# Patient Record
Sex: Female | Born: 1970 | Race: White | Hispanic: No | Smoking: Former smoker
Health system: Southern US, Community
[De-identification: ages and names within clinical notes are randomized; demographics above are authoritative.]

## PROBLEM LIST (undated history)

## (undated) DIAGNOSIS — O039 Complete or unspecified spontaneous abortion without complication: Secondary | ICD-10-CM

## (undated) DIAGNOSIS — Z720 Tobacco use: Secondary | ICD-10-CM

## (undated) DIAGNOSIS — N200 Calculus of kidney: Secondary | ICD-10-CM

## (undated) DIAGNOSIS — I10 Essential (primary) hypertension: Secondary | ICD-10-CM

## (undated) DIAGNOSIS — N3020 Other chronic cystitis without hematuria: Secondary | ICD-10-CM

## (undated) DIAGNOSIS — R87629 Unspecified abnormal cytological findings in specimens from vagina: Secondary | ICD-10-CM

## (undated) DIAGNOSIS — F419 Anxiety disorder, unspecified: Secondary | ICD-10-CM

## (undated) DIAGNOSIS — Z8759 Personal history of other complications of pregnancy, childbirth and the puerperium: Secondary | ICD-10-CM

## (undated) DIAGNOSIS — Z1371 Encounter for nonprocreative screening for genetic disease carrier status: Secondary | ICD-10-CM

## (undated) HISTORY — DX: Encounter for nonprocreative screening for genetic disease carrier status: Z13.71

## (undated) HISTORY — PX: ENDOMETRIAL ABLATION: SHX621

## (undated) HISTORY — PX: TUBAL LIGATION: SHX77

## (undated) HISTORY — DX: Other chronic cystitis without hematuria: N30.20

## (undated) HISTORY — DX: Unspecified abnormal cytological findings in specimens from vagina: R87.629

## (undated) HISTORY — DX: Personal history of other complications of pregnancy, childbirth and the puerperium: Z87.59

## (undated) HISTORY — PX: TONSILLECTOMY: SUR1361

## (undated) HISTORY — DX: Calculus of kidney: N20.0

## (undated) HISTORY — DX: Anxiety disorder, unspecified: F41.9

## (undated) HISTORY — DX: Complete or unspecified spontaneous abortion without complication: O03.9

---

## 1997-08-12 DIAGNOSIS — N200 Calculus of kidney: Secondary | ICD-10-CM

## 1997-08-12 HISTORY — DX: Calculus of kidney: N20.0

## 1998-10-21 ENCOUNTER — Emergency Department (HOSPITAL_COMMUNITY): Admission: EM | Admit: 1998-10-21 | Discharge: 1998-10-21 | Payer: Self-pay | Admitting: Emergency Medicine

## 1998-12-30 ENCOUNTER — Inpatient Hospital Stay (HOSPITAL_COMMUNITY): Admission: EM | Admit: 1998-12-30 | Discharge: 1999-01-02 | Payer: Self-pay | Admitting: Emergency Medicine

## 1998-12-31 ENCOUNTER — Encounter: Payer: Self-pay | Admitting: Family Medicine

## 2000-12-30 ENCOUNTER — Other Ambulatory Visit: Admission: RE | Admit: 2000-12-30 | Discharge: 2000-12-30 | Payer: Self-pay | Admitting: Gynecology

## 2002-04-21 ENCOUNTER — Other Ambulatory Visit: Admission: RE | Admit: 2002-04-21 | Discharge: 2002-04-21 | Payer: Self-pay | Admitting: Family Medicine

## 2002-08-12 LAB — CONVERTED CEMR LAB: Pap Smear: NORMAL

## 2003-05-03 ENCOUNTER — Other Ambulatory Visit: Admission: RE | Admit: 2003-05-03 | Discharge: 2003-05-03 | Payer: Self-pay | Admitting: Obstetrics and Gynecology

## 2004-11-28 ENCOUNTER — Emergency Department (HOSPITAL_COMMUNITY): Admission: EM | Admit: 2004-11-28 | Discharge: 2004-11-28 | Payer: Self-pay | Admitting: Emergency Medicine

## 2007-11-03 ENCOUNTER — Encounter: Payer: Self-pay | Admitting: Internal Medicine

## 2007-11-03 ENCOUNTER — Ambulatory Visit: Payer: Self-pay | Admitting: Internal Medicine

## 2007-11-03 ENCOUNTER — Other Ambulatory Visit: Admission: RE | Admit: 2007-11-03 | Discharge: 2007-11-03 | Payer: Self-pay | Admitting: Internal Medicine

## 2007-11-03 ENCOUNTER — Encounter (INDEPENDENT_AMBULATORY_CARE_PROVIDER_SITE_OTHER): Payer: Self-pay | Admitting: *Deleted

## 2007-11-06 ENCOUNTER — Encounter (INDEPENDENT_AMBULATORY_CARE_PROVIDER_SITE_OTHER): Payer: Self-pay | Admitting: *Deleted

## 2007-11-06 LAB — CONVERTED CEMR LAB
ALT: 19 units/L (ref 0–35)
AST: 20 units/L (ref 0–37)
BUN: 7 mg/dL (ref 6–23)
Basophils Absolute: 0 10*3/uL (ref 0.0–0.1)
Basophils Relative: 0.4 % (ref 0.0–1.0)
CO2: 26 meq/L (ref 19–32)
Calcium: 9.7 mg/dL (ref 8.4–10.5)
Chloride: 106 meq/L (ref 96–112)
Cholesterol: 240 mg/dL (ref 0–200)
Creatinine, Ser: 0.7 mg/dL (ref 0.4–1.2)
Direct LDL: 172.4 mg/dL
Eosinophils Absolute: 0.3 10*3/uL (ref 0.0–0.6)
Eosinophils Relative: 2.5 % (ref 0.0–5.0)
GFR calc Af Amer: 122 mL/min
GFR calc non Af Amer: 101 mL/min
Glucose, Bld: 79 mg/dL (ref 70–99)
HCT: 44.8 % (ref 36.0–46.0)
Hemoglobin: 15 g/dL (ref 12.0–15.0)
Lymphocytes Relative: 25.8 % (ref 12.0–46.0)
MCHC: 33.6 g/dL (ref 30.0–36.0)
MCV: 88.5 fL (ref 78.0–100.0)
Monocytes Absolute: 0.6 10*3/uL (ref 0.2–0.7)
Monocytes Relative: 5.9 % (ref 3.0–11.0)
Neutro Abs: 6.6 10*3/uL (ref 1.4–7.7)
Neutrophils Relative %: 65.4 % (ref 43.0–77.0)
Platelets: 203 10*3/uL (ref 150–400)
Potassium: 3.6 meq/L (ref 3.5–5.1)
RBC: 5.06 M/uL (ref 3.87–5.11)
RDW: 13 % (ref 11.5–14.6)
Sodium: 140 meq/L (ref 135–145)
TSH: 1.68 microintl units/mL (ref 0.35–5.50)
WBC: 10.1 10*3/uL (ref 4.5–10.5)

## 2007-11-10 ENCOUNTER — Encounter (INDEPENDENT_AMBULATORY_CARE_PROVIDER_SITE_OTHER): Payer: Self-pay | Admitting: *Deleted

## 2007-11-16 ENCOUNTER — Encounter: Payer: Self-pay | Admitting: Internal Medicine

## 2007-12-30 ENCOUNTER — Encounter: Admission: RE | Admit: 2007-12-30 | Discharge: 2007-12-30 | Payer: Self-pay | Admitting: Internal Medicine

## 2008-01-01 ENCOUNTER — Encounter (INDEPENDENT_AMBULATORY_CARE_PROVIDER_SITE_OTHER): Payer: Self-pay | Admitting: *Deleted

## 2008-03-14 ENCOUNTER — Ambulatory Visit: Payer: Self-pay | Admitting: Internal Medicine

## 2008-03-14 DIAGNOSIS — J069 Acute upper respiratory infection, unspecified: Secondary | ICD-10-CM | POA: Insufficient documentation

## 2008-03-14 DIAGNOSIS — N390 Urinary tract infection, site not specified: Secondary | ICD-10-CM | POA: Insufficient documentation

## 2008-03-16 ENCOUNTER — Encounter: Payer: Self-pay | Admitting: Internal Medicine

## 2008-03-16 LAB — CONVERTED CEMR LAB

## 2008-05-12 ENCOUNTER — Telehealth (INDEPENDENT_AMBULATORY_CARE_PROVIDER_SITE_OTHER): Payer: Self-pay | Admitting: *Deleted

## 2008-05-12 ENCOUNTER — Ambulatory Visit: Payer: Self-pay | Admitting: Internal Medicine

## 2008-05-12 LAB — CONVERTED CEMR LAB
Glucose, Urine, Semiquant: NEGATIVE
Specific Gravity, Urine: >=1.03

## 2008-05-13 ENCOUNTER — Encounter: Payer: Self-pay | Admitting: Internal Medicine

## 2008-05-13 LAB — CONVERTED CEMR LAB

## 2008-05-17 ENCOUNTER — Telehealth (INDEPENDENT_AMBULATORY_CARE_PROVIDER_SITE_OTHER): Payer: Self-pay | Admitting: *Deleted

## 2008-05-31 ENCOUNTER — Emergency Department (HOSPITAL_BASED_OUTPATIENT_CLINIC_OR_DEPARTMENT_OTHER): Admission: EM | Admit: 2008-05-31 | Discharge: 2008-05-31 | Payer: Self-pay | Admitting: Emergency Medicine

## 2008-05-31 ENCOUNTER — Ambulatory Visit: Payer: Self-pay | Admitting: Internal Medicine

## 2008-05-31 DIAGNOSIS — N209 Urinary calculus, unspecified: Secondary | ICD-10-CM | POA: Insufficient documentation

## 2008-06-02 ENCOUNTER — Encounter (INDEPENDENT_AMBULATORY_CARE_PROVIDER_SITE_OTHER): Payer: Self-pay | Admitting: *Deleted

## 2008-06-02 ENCOUNTER — Ambulatory Visit: Payer: Self-pay | Admitting: Internal Medicine

## 2008-06-04 ENCOUNTER — Emergency Department (HOSPITAL_COMMUNITY): Admission: EM | Admit: 2008-06-04 | Discharge: 2008-06-04 | Payer: Self-pay | Admitting: Emergency Medicine

## 2009-01-27 ENCOUNTER — Telehealth: Payer: Self-pay | Admitting: Internal Medicine

## 2009-05-08 ENCOUNTER — Ambulatory Visit: Payer: Self-pay | Admitting: Internal Medicine

## 2009-05-08 DIAGNOSIS — F411 Generalized anxiety disorder: Secondary | ICD-10-CM | POA: Insufficient documentation

## 2009-05-08 DIAGNOSIS — A6000 Herpesviral infection of urogenital system, unspecified: Secondary | ICD-10-CM | POA: Insufficient documentation

## 2009-05-08 LAB — CONVERTED CEMR LAB
Nitrite: NEGATIVE
Protein, U semiquant: NEGATIVE
Urobilinogen, UA: 0.2

## 2009-05-09 ENCOUNTER — Encounter: Payer: Self-pay | Admitting: Internal Medicine

## 2009-05-10 ENCOUNTER — Encounter: Payer: Self-pay | Admitting: Internal Medicine

## 2009-05-12 ENCOUNTER — Telehealth (INDEPENDENT_AMBULATORY_CARE_PROVIDER_SITE_OTHER): Payer: Self-pay | Admitting: *Deleted

## 2009-07-31 IMAGING — CT CT ABDOMEN W/O CM
2 of 4 series · 17 of 46 positions shown, 19 images · non-contrast
Comparison: None

CT ABDOMEN

CLINICAL DATA: Right lower quadrant abdominal pain.

CT ABDOMEN AND PELVIS WITHOUT CONTRAST
TECHNIQUE: Multidetector CT imaging of the abdomen and pelvis was
performed following the standard protocol without intravenous
contrast.

[Series 2: renal stone < 200 lbs 5.0 b31f · axial · 0.82mm/px · z∈[+820,+1270]mm · 14 of 100 slices shown, 16 images]
[im 5/100  soft-tissue]
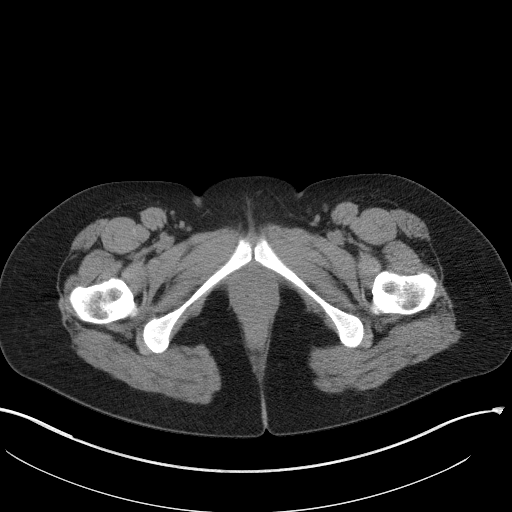
[im 5/100  bone]
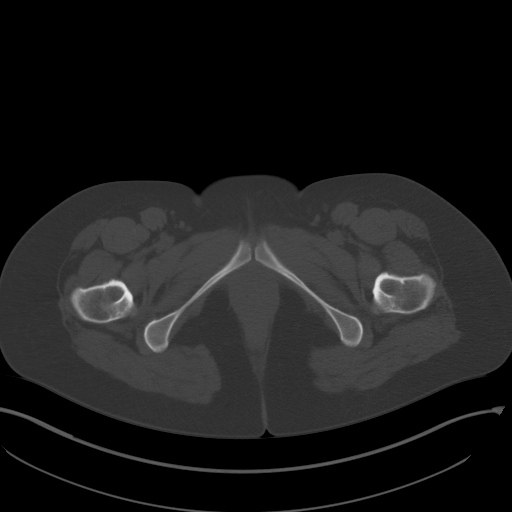
[im 13/100  soft-tissue]
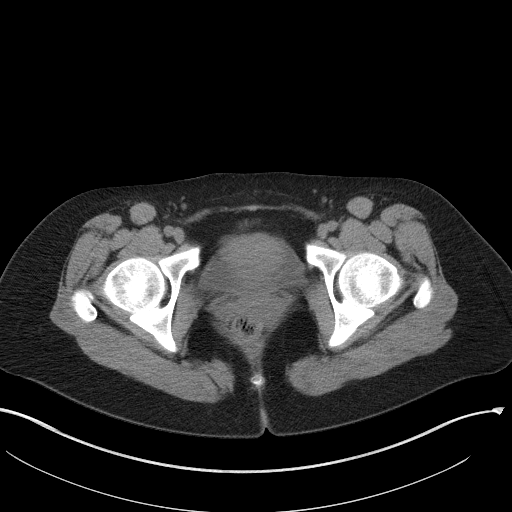
[im 21/100  soft-tissue]
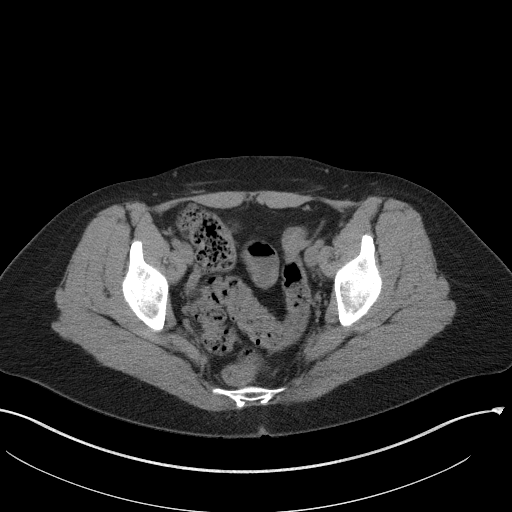
[im 25/100  soft-tissue]
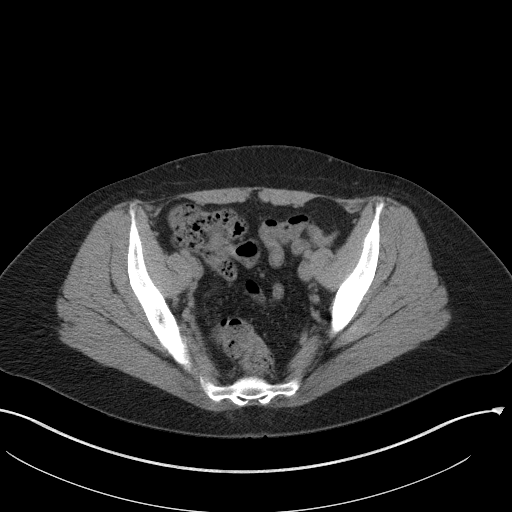
[im 34/100  soft-tissue]
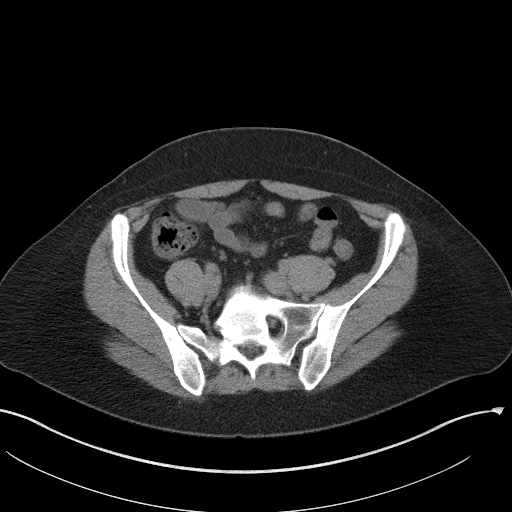
[im 42/100  soft-tissue]
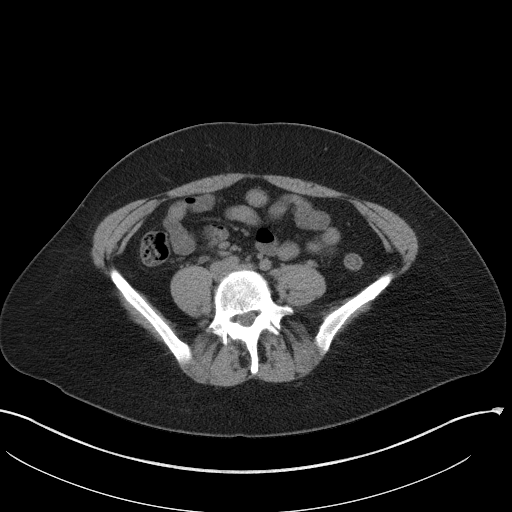
[im 46/100  soft-tissue]
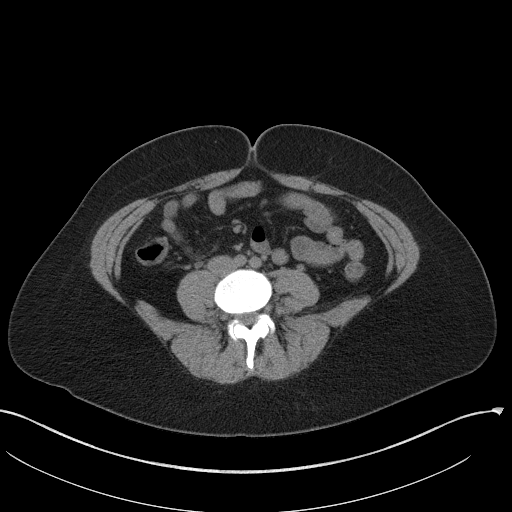
[im 54/100  soft-tissue]
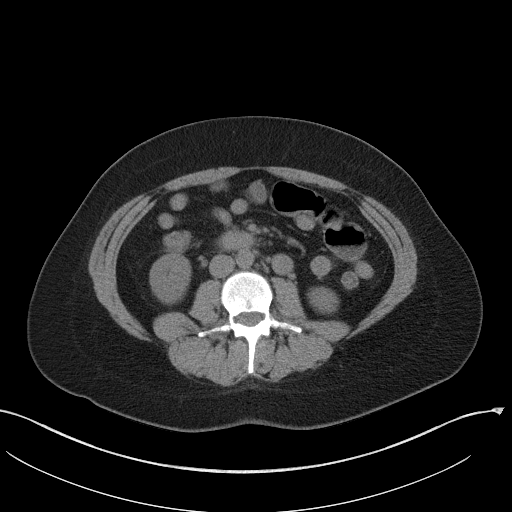
[im 58/100  soft-tissue]
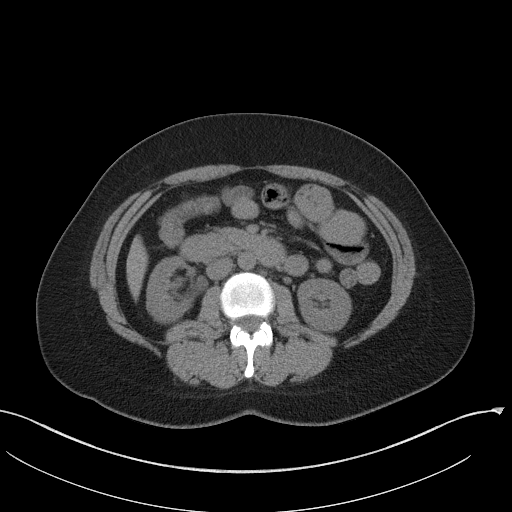
[im 58/100  bone]
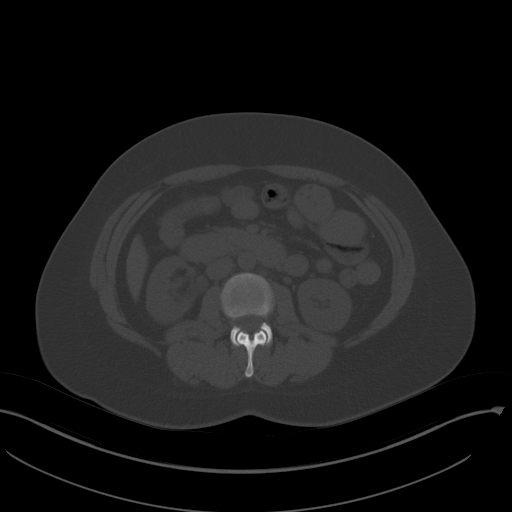
[im 67/100  soft-tissue]
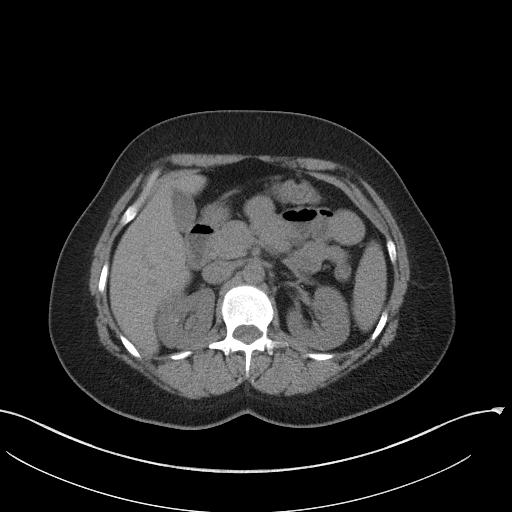
[im 75/100  soft-tissue]
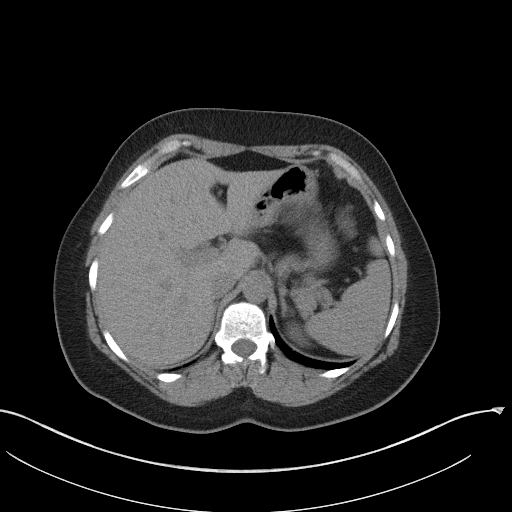
[im 79/100  soft-tissue]
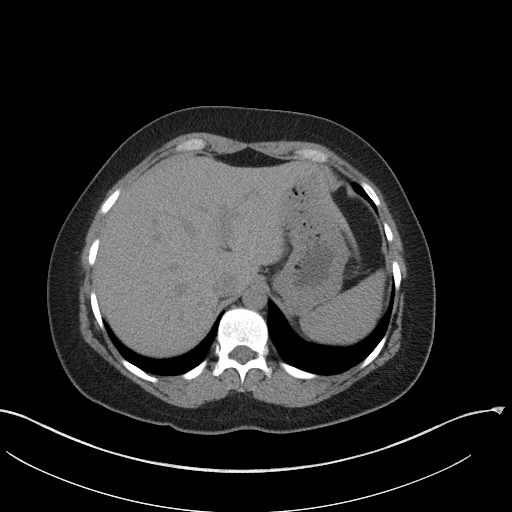
[im 87/100  soft-tissue]
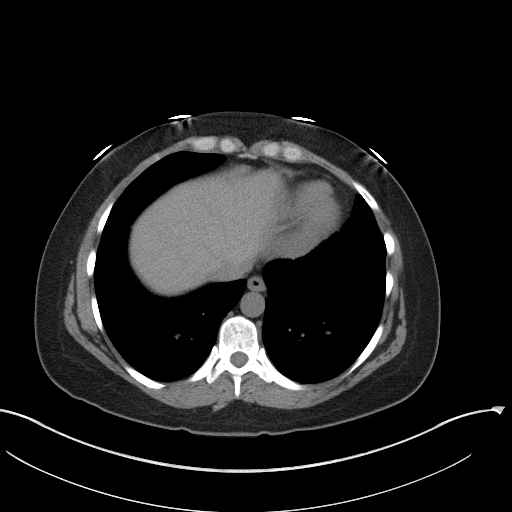
[im 95/100  soft-tissue]
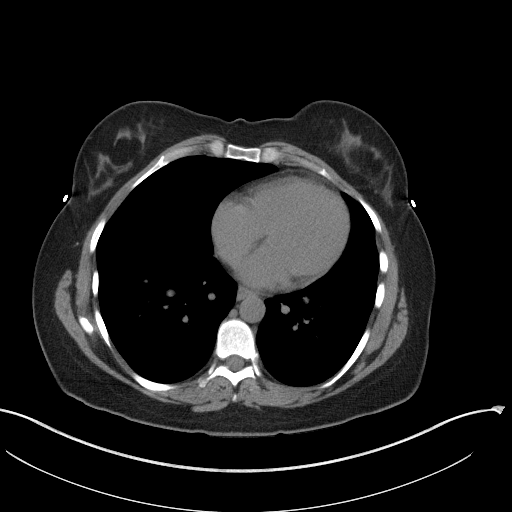

[Series 3: renal stone 2.0 coronal · coronal · 0.88mm/px · 3 of 126 slices shown]
[im 42/126  soft-tissue]
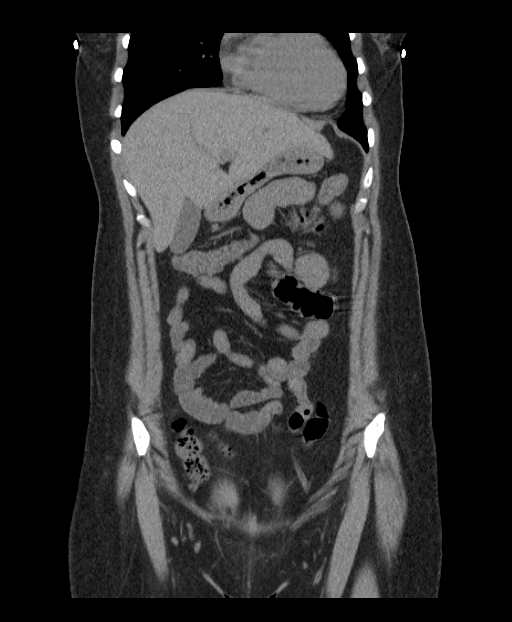
[im 56/126  soft-tissue]
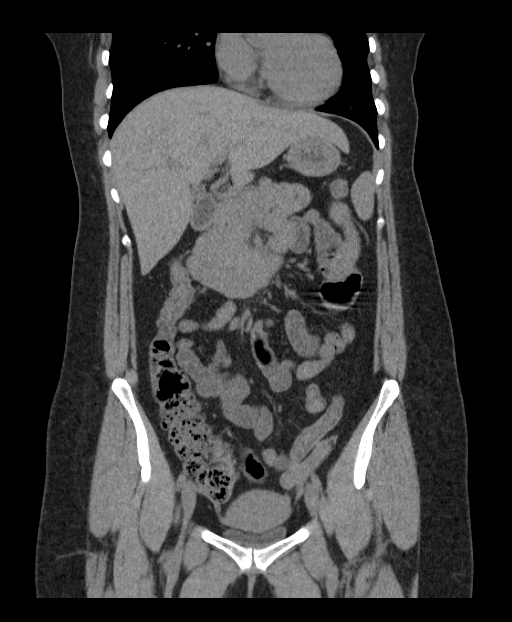
[im 70/126  soft-tissue]
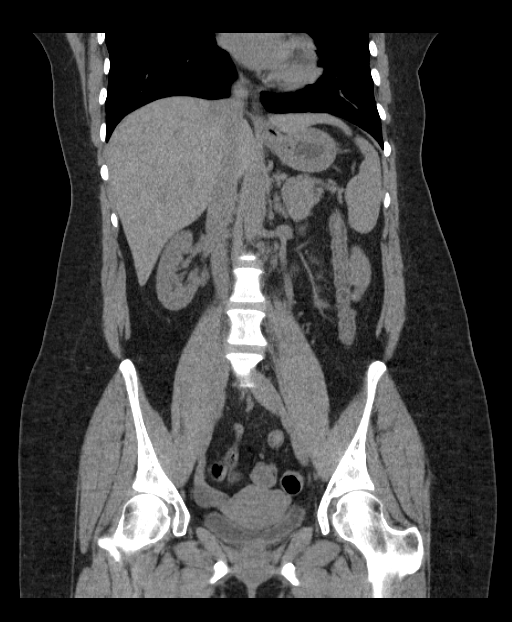

[17 of 46 positions shown; findings below may reference images not displayed]

FINDINGS: There is mild right ureterectasis to the level of a 4 mm
distal right ureteral calculus on image 86.  No significant
hydronephrosis.  1 mm nonobstructing right upper renal pole
calculus is identified on image 35.  No left-sided
nephroureterolithiasis.

Lung bases are clear.  Unenhanced abdominal viscera otherwise
unremarkable.  No lymphadenopathy or ascites.
IMPRESSION: 4 mm distal right ureteral calculus producing mild right
ureterectasis.

CT PELVIS
FINDINGS: The appendix and unopacified bowel are unremarkable.  No
pelvic ascites.  No lymphadenopathy or ascites.  No acute bony
finding. Mild dextrorotatory lumbar spine scoliosis noted.
IMPRESSION: No acute intrapelvic finding.  Please see CT abdomen report above.

## 2009-09-13 ENCOUNTER — Ambulatory Visit: Payer: Self-pay | Admitting: Internal Medicine

## 2009-09-13 LAB — CONVERTED CEMR LAB
Bilirubin Urine: NEGATIVE
Casts: NONE SEEN /lpf
Glucose, Urine, Semiquant: NEGATIVE
Ketones, urine, test strip: NEGATIVE
Protein, U semiquant: NEGATIVE

## 2009-09-14 ENCOUNTER — Encounter: Payer: Self-pay | Admitting: Internal Medicine

## 2009-10-17 ENCOUNTER — Telehealth: Payer: Self-pay | Admitting: Internal Medicine

## 2009-10-18 ENCOUNTER — Ambulatory Visit: Payer: Self-pay | Admitting: Internal Medicine

## 2009-10-19 ENCOUNTER — Telehealth (INDEPENDENT_AMBULATORY_CARE_PROVIDER_SITE_OTHER): Payer: Self-pay | Admitting: *Deleted

## 2009-10-25 ENCOUNTER — Ambulatory Visit: Payer: Self-pay | Admitting: Family

## 2009-10-25 LAB — CONVERTED CEMR LAB: Urobilinogen, UA: 0.2

## 2009-10-27 ENCOUNTER — Encounter: Payer: Self-pay | Admitting: Family

## 2010-01-03 ENCOUNTER — Ambulatory Visit: Payer: Self-pay | Admitting: Family Medicine

## 2010-01-03 LAB — CONVERTED CEMR LAB
Glucose, Urine, Semiquant: NEGATIVE
Ketones, urine, test strip: NEGATIVE
Nitrite: NEGATIVE
Protein, U semiquant: NEGATIVE

## 2010-02-20 ENCOUNTER — Telehealth (INDEPENDENT_AMBULATORY_CARE_PROVIDER_SITE_OTHER): Payer: Self-pay | Admitting: *Deleted

## 2010-05-11 ENCOUNTER — Ambulatory Visit: Payer: Self-pay | Admitting: Internal Medicine

## 2010-05-11 LAB — CONVERTED CEMR LAB
Bilirubin Urine: NEGATIVE
Casts: NONE SEEN /lpf
Crystals: NONE SEEN
Protein, U semiquant: NEGATIVE
Urobilinogen, UA: 0.2
pH: 7

## 2010-05-12 ENCOUNTER — Encounter: Payer: Self-pay | Admitting: Internal Medicine

## 2010-05-30 ENCOUNTER — Ambulatory Visit: Payer: Self-pay | Admitting: Internal Medicine

## 2010-05-30 LAB — CONVERTED CEMR LAB
Glucose, Urine, Semiquant: NEGATIVE
Nitrite: NEGATIVE
Specific Gravity, Urine: 1.015
WBC Urine, dipstick: NEGATIVE
pH: 7

## 2010-09-13 NOTE — Progress Notes (Signed)
Summary: depressed  Phone Note Call from Patient Call back at [phone]   Details for Reason: Patient left message on VM requesting call back Summary of Call:  - left message on machine for pt to return call Shary Decamp  October 17, 2009 9:55 AM   - anxious, nervous, depressed, "uneasy feeling", emotional, not sleeping  - father passed away last week (jerry clapp)  - not suicidal  - pt discussed @ ov 04/2009 -- was referred to psych but unable to go because of $$  - intolerant to SSRI Patient wants to know if you would like to see her or rx her something.  I will give her the number to some counseling services when I call her back.  She can only come in today after 4 or tomorrow am Shary Decamp  October 17, 2009 11:56 AM   Follow-up for Phone Call        OV in the next few days klonopin 0.5mg  three times a day as needed for anxiety #40, no Rf watch for somnolence Follow-up by: Nolon Rod. Zionah Criswell MD,  October 17, 2009 1:03 PM  Additional Follow-up for Phone Call Additional follow up Details #1::        discussed with pt Additional Follow-up by: Shary Decamp,  October 17, 2009 1:13 PM    New/Updated Medications: KLONOPIN 0.5 MG TABS (CLONAZEPAM) 1 by mouth three times a day Prescriptions: KLONOPIN 0.5 MG TABS (CLONAZEPAM) 1 by mouth three times a day  #40 x 0   Entered by:   Shary Decamp   Authorized by:   Nolon Rod. Veda Arrellano MD   Signed by:   Shary Decamp on 10/17/2009   Method used:   Printed then faxed to ...       [facility]  [address] [id number]* (retail)       [address]       [place], [place]  [address]       Ph: [phone] or [phone]       Fax: [phone]   RxID:   [id number]

## 2010-09-13 NOTE — Progress Notes (Signed)
Summary: Refill Request  Phone Note Refill Request Message from:  Patient on February 20, 2010 9:52 AM  Refills Requested: Medication #1:  VALTREX 500 MG TABS 1 by mouth two times a day x 3 days for each episode of herpes   Dosage confirmed as above?Dosage Confirmed   Supply Requested: 3 months Initial call taken by: Harold Barban,  February 20, 2010 9:52 AM  Follow-up for Phone Call        pt is aware. Army Fossa CMA  February 20, 2010 10:09 AM     Prescriptions: VALTREX 500 MG TABS (VALACYCLOVIR HCL) 1 by mouth two times a day x 3 days for each episode of herpes  #90 x 0   Entered by:   Army Fossa CMA   Authorized by:   Nolon Rod. Paz MD   Signed by:   Army Fossa CMA on 02/20/2010   Method used:   Electronically to        [id number]* (retail)       [address]       [place], [place]  [address]       Ph: [phone] or [phone]       Fax: [phone]   RxID:   [id number]

## 2010-09-13 NOTE — Assessment & Plan Note (Signed)
Summary: PAINFUL URINATION/RH......   Vital Signs:  Patient profile:   40 year old female Weight:      176 pounds Temp:     98.4 degrees F oral BP sitting:   130 / 80  (left arm)  Vitals Entered By: Doristine Devoid (Jan 03, 2010 1:16 PM) CC: uti sx started this morning    History of Present Illness: 40 yo woman here today for 'UTI sxs'.  + urgency, hesitancy, pressure.  no dysuria.  no blood.  sxs started this AM.  has long hx of this.  has multiple drug allergies, reports ceftin doesn't cause any issues.  Allergies (verified): 1)  ! Codeine 2)  ! Cipro 3)  ! Levaquin 4)  ! Percocet 5)  ! Nortriptyline Hcl 6)  ! * Bee Stings  Review of Systems      See HPI  Physical Exam  General:  Well-developed,well-nourished,in no acute distress; alert,appropriate and cooperative throughout examination Abdomen:  soft, non-tender, no distention, no masses, no guarding, and no rigidity.  no CVA tenderness   Impression & Recommendations:  Problem # 1:  URINARY TRACT INFECTION, RECURRENT (ICD-599.0) Assessment Unchanged UA suspicious for infxn.  resume abx.  reviewed supportive care and red flags that should prompt return.  Pt expresses understanding and is in agreement w/ this plan. Her updated medication list for this problem includes:    Ceftin 500 Mg Tabs (Cefuroxime axetil) ..... One tab by mouth two times a day x 7 days  Orders: Specimen Handling (04540) T-Culture, Urine ([id number]) UA Dipstick w/o Micro (manual) (81002)  Complete Medication List: 1)  Valtrex 500 Mg Tabs (Valacyclovir hcl) .Marland Kitchen.. 1 by mouth two times a day x 3 days for each episode of herpes 2)  Ativan 0.5 Mg Tabs (Lorazepam) .... Take 1-2 tablets three times a day as needed 3)  Ceftin 500 Mg Tabs (Cefuroxime axetil) .... One tab by mouth two times a day x 7 days 4)  Diflucan 150 Mg Tabs (Fluconazole) .... Once daily, may repeat in 3 days if symptoms persist  Patient Instructions: 1)  Take the Ceftin as  directed- take with food to avoid upset stomach 2)  Continue your cranberry extract 3)  Use the Diflucan as needed 4)  Hang in there! Prescriptions: DIFLUCAN 150 MG TABS (FLUCONAZOLE) once daily, may repeat in 3 days if symptoms persist  #2 x 0   Entered and Authorized by:   Neena Rhymes MD   Signed by:   Neena Rhymes MD on 01/03/2010   Method used:   Electronically to        [facility]  [address] [id number]* (retail)       [address]       [place], [place]  [address]       Ph: [phone] or [phone]       Fax: [phone]   RxID:   [id number] CEFTIN 500 MG TABS (CEFUROXIME AXETIL) one tab by mouth two times a day x 7 days  #14 x 0   Entered and Authorized by:   Neena Rhymes MD   Signed by:   Neena Rhymes MD on 01/03/2010   Method used:   Electronically to        [facility]  [address] [id number]* (retail)       [address]       [place], [place]  [address]  Ph: [phone] or [phone]       Fax: [phone]   RxID:   [id number]   Laboratory Results   Urine Tests    Routine Urinalysis   Glucose: negative   (Normal Range: Negative) Bilirubin: negative   (Normal Range: Negative) Ketone: negative   (Normal Range: Negative) Spec. Gravity: 1.015   (Normal Range: 1.003-1.035) Blood: moderate   (Normal Range: Negative) pH: 6.0   (Normal Range: 5.0-8.0) Protein: negative   (Normal Range: Negative) Urobilinogen: 0.2   (Normal Range: 0-1) Nitrite: negative   (Normal Range: Negative) Leukocyte Esterace: moderate   (Normal Range: Negative)

## 2010-09-13 NOTE — Assessment & Plan Note (Signed)
Summary: acute/uti pain,pressure,blood in urine/alr   Vital Signs:  Patient profile:   40 year old female Weight:      184.50 pounds BMI:     29.00 Temp:     97.5 degrees F oral Pulse rate:   84 / minute Pulse rhythm:   regular Resp:     16 per minute BP sitting:   90 / 68  (right arm) Cuff size:   large  Vitals Entered By: Mervin Kung CMA (October 25, 2009 11:32 AM) CC: ROOM 16  Possible UTI? States she has frequent UTIs for years; has urologist.   CC:  ROOM 16  Possible UTI? States she has frequent UTIs for years; has urologist..  History of Present Illness: [name] is a 40 year old female who woke up with suprapubic pressure/pain, urinary frequency, pink colored urine.  Denies fever.  Denies low back pain.  Patient sees Dr.  Vonita Moss in 2000 due to history of kidney stones.  Had kidney failure back in 2000.  Notes that her last UTI was   Anxiety- was recently started on Klonopin which was changed to ativan.  Father recently passed away.  Notes that she has felt angry since starting nortriptyline.  Has not taken in 24 hours, wants to do therapy instead.  Feels better since she discontinued these meds.    Allergies: 1)  ! Codeine 2)  ! Cipro 3)  ! Levaquin 4)  ! Percocet 5)  ! Nortriptyline Hcl 6)  ! * Bee Stings  Physical Exam  General:  Well-developed,well-nourished,in no acute distress; alert,appropriate and cooperative throughout examination Head:  Normocephalic and atraumatic without obvious abnormalities. No apparent alopecia or balding. Psych:  Cognition and judgment appear intact. Alert and cooperative with normal attention span and concentration. No apparent delusions, illusions, hallucinations   Impression & Recommendations:  Problem # 1:  URINARY TRACT INFECTION, RECURRENT (ICD-599.0) Assessment New Will plan to treat with Ceftin based on last culture sensitivity data.  I suggested to patient that she call to arrange follow up with Dr. Vonita Moss to  discuss possiblity of starting a suppressive antibiotic therapy given frequecy of her UTI's.   Orders: UA Dipstick w/o Micro (manual) (04540) T-Urine Culture (Spectrum Order) [phone])  Her updated medication list for this problem includes:    Ceftin 500 Mg Tabs (Cefuroxime axetil) ..... One tab by mouth two times a day x 7 days  Problem # 2:  ANXIETY (ICD-300.00) Assessment: New Denies suicide ideation- pt has d/c'd psych meds.  Instructed patient to call if symptoms worsen.  Patient tells me that she plans to continue with therapy. The following medications were removed from the medication list:    Nortriptyline Hcl 25 Mg Caps (Nortriptyline hcl) .Marland Kitchen... 1 or 2 by mouth at bedtime as needed  insomnia Her updated medication list for this problem includes:    Ativan 0.5 Mg Tabs (Lorazepam) .Marland Kitchen... Take 1-2 tablets three times a day as needed  Complete Medication List: 1)  Valtrex 500 Mg Tabs (Valacyclovir hcl) .Marland Kitchen.. 1 by mouth two times a day x 3 days for each episode of herpes 2)  Ativan 0.5 Mg Tabs (Lorazepam) .... Take 1-2 tablets three times a day as needed 3)  Ceftin 500 Mg Tabs (Cefuroxime axetil) .... One tab by mouth two times a day x 7 days  Patient Instructions: 1)  Please call if you develop fever over 101, low back pain, nausea/vomitting, if symptoms worsen or if they do not resolve in the next few  days. 2)  Call if you develop worsening symptoms of anxiety or depression Prescriptions: CEFTIN 500 MG TABS (CEFUROXIME AXETIL) one tab by mouth two times a day x 7 days  #14 x 0   Entered and Authorized by:   Lemont Fillers FNP   Signed by:   Lemont Fillers FNP on 10/25/2009   Method used:   Electronically to        [id number]* (retail)       [address]       [place], [place]  [address]       Ph: [phone] or [phone]       Fax: [phone]   RxID:   [id number]   Laboratory Results   Urine  Tests    Routine Urinalysis   Color: lt. yellow Appearance: Cloudy Glucose: negative   (Normal Range: Negative) Bilirubin: negative   (Normal Range: Negative) Ketone: negative   (Normal Range: Negative) Spec. Gravity: <1.005   (Normal Range: 1.003-1.035) Blood: moderate   (Normal Range: Negative) pH: 8.0   (Normal Range: 5.0-8.0) Protein: trace   (Normal Range: Negative) Urobilinogen: 0.2   (Normal Range: 0-1) Nitrite: negative   (Normal Range: Negative) Leukocyte Esterace: moderate   (Normal Range: Negative)       Current Allergies (reviewed today): ! CODEINE ! CIPRO ! LEVAQUIN ! PERCOCET ! NORTRIPTYLINE HCL ! * BEE STINGS

## 2010-09-13 NOTE — Assessment & Plan Note (Signed)
Summary: BLOOD IN URINE/PRESSURE//KN   Vital Signs:  Patient profile:   40 year old female Weight:      161.25 pounds Pulse rate:   72 / minute Pulse rhythm:   regular BP sitting:   122 / 80  (left arm) Cuff size:   large  Vitals Entered By: Army Fossa CMA (May 11, 2010 10:28 AM) CC: C/o blood in urine, feels pressure. Comments Started this am at 3.  Pharm- [facility] [address]   History of Present Illness: C/o blood in urine, having urinary urgency and suprapubic pressure sx started this am at 3.   Allergies: 1)  ! Codeine 2)  ! Cipro 3)  ! Levaquin 4)  ! Percocet 5)  ! Nortriptyline Hcl 6)  ! * Bee Stings  Past History:  Past Medical History: Reviewed history from 05/08/2009 and no changes required. 1999 kidney failure (rt kidney functions at 40%), sees Urology routinely  Alexis Goodell) kidney stones, recurrent G4 P2,  abortion x 1 miscarriage x 1 scarlet fever x 3 h/o uterine ablation aprox 2004-- had prolonged / heavy periods  (gyn Dr Lily Peer) NO h/o abnormal PAPs Anxiety  Past Surgical History: Reviewed history from 11/03/2007 and no changes required. Caesarean section Tonsillectomy Tubal ligation  Social History: Reviewed history from 10/18/2009 and no changes required. 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 GU:  Denies dysuria; no back or abdominal pain.  Physical Exam  General:  alert, well-developed, and well-nourished.   Abdomen:  soft, non-tender, no distention, no masses, no guarding, and no rigidity.   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 1 by mouth two times a day x 5 days  Orders: UA Dipstick w/o Micro (manual) ([address]) T-Urine Microscopic ([id number]) T-Culture, Urine ([id number])  Problem # 2:  patient is losing weight doing great with diet and exercise, praised!  Complete Medication List: 1)  Valtrex 500 Mg Tabs (Valacyclovir hcl) .Marland Kitchen.. 1 by mouth two times a day x 3 days for each episode of herpes 2)  Ceftin 500 Mg Tabs (Cefuroxime axetil) .Marland Kitchen.. 1 by mouth two times a day x 5 days  Patient Instructions: 1)  Drink plenty of fluids up to 3-4 quarts a day.  2)   Return in 3-5 days if you're not better: sooner if you're feeling worse.  3)  came back in 3 weeks for a UCX, UA dx UTI Prescriptions: CEFTIN 500 MG TABS (CEFUROXIME AXETIL) 1 by mouth two times a day x 5 days  #10 x 0   Entered and Authorized by:   Nolon Rod. Paz MD   Signed by:   Nolon Rod. Paz MD on 05/11/2010   Method used:   Electronically to        [id number]* (retail)       [address]       [facility]  [place], [place]  [address]       Ph: [phone] or [phone]       Fax: [phone]   RxID:   [id number]   Laboratory Results   Urine Tests    Routine Urinalysis   Color: straw Appearance: Hazy Glucose: negative   (Normal Range: Negative) Bilirubin: negative   (Normal Range: Negative) Ketone: negative   (Normal Range: Negative) Spec. Gravity: 1.020   (Normal Range: 1.003-1.035) Blood: trace-intact   (Normal Range: Negative) pH: 7.0   (Normal Range: 5.0-8.0) Protein: negative   (Normal Range: Negative) Urobilinogen: 0.2   (Normal Range: 0-1) Nitrite: negative   (Normal Range: Negative) Leukocyte Esterace: negative   (Normal Range: Negative)    Comments: Army Fossa CMA  May 11, 2010 10:29  AM

## 2010-09-13 NOTE — Progress Notes (Signed)
Summary: feeling worse  Phone Note Call from Patient Call back at Home Phone [phone]   Caller: Patient Summary of Call: patient left msg on voicemail to informed us how she was doing.  Initial call taken by: Doristine Devoid,  October 19, 2009 9:17 AM  Follow-up for Phone Call        patient says this morning was worse that yesterday she woke up feeling nervousness weepiness also stated that she just felt angry for no reason and is concern with all that she has going on would like to know if there is something she could take in place of klonopin..........Marland KitchenDoristine Devoid  October 19, 2009 9:19 AM   she can increase Klonopin to two tablets t.i.d. Jose E. Paz MD  October 19, 2009 10:01 AM   informed patient of recommendations says she read information that ithis is one of the side effects of the medication and doesn't think that this is going to help w/ irritability should would feel more comfortable trying a different med.Marland KitchenMarland KitchenDoristine Devoid  October 19, 2009 12:11 PM  Follow-up by:    Additional Follow-up for Phone Call Additional follow up Details #1::        ativan 0.5 mg 1 or 2 tabs  three times a day #60 , no RF Additional Follow-up by: Jose E. Paz MD,  October 19, 2009 12:57 PM    Additional Follow-up for Phone Call Additional follow up Details #2::    spoke w/ patient aware of change to d/c klonopin and start ativan to call if any concerns.........Marland KitchenDoristine Devoid  October 19, 2009 2:02 PM   New/Updated Medications: ATIVAN 0.5 MG TABS (LORAZEPAM) take 1-2 tablets three times a day as needed Prescriptions: ATIVAN 0.5 MG TABS (LORAZEPAM) take 1-2 tablets three times a day as needed  #60 x 0   Entered by:   Doristine Devoid   Authorized by:   Nolon Rod. Paz MD   Signed by:   Doristine Devoid on 10/19/2009   Method used:   Telephoned to ...       [facility]  [address] [id number]* (retail)       [address]       [place], [place]  [address]       Ph: [phone] or [phone]     Fax: [phone]   RxID:   [id number]

## 2010-09-13 NOTE — Letter (Signed)
   [place] [facility] [address] [place], [place]  [address]  [place] Phone: [phone]      October 27, 2009   ARLY SALMINEN [address] [place], [place] [address]  RE:  LAB RESULTS  Dear  Ms. Velardi,  The following is an interpretation of your most recent lab tests.  Please take note of any instructions provided or changes to medications that have resulted from your lab work.   Urine culture positive for bacteria- multiple different types.  Please complete antibiotics and call if your symptoms do not resolve.   Sincerely Yours,    Lemont Fillers FNP

## 2010-09-13 NOTE — Assessment & Plan Note (Signed)
Summary: anxiety/swh   Vital Signs:  Patient profile:   40 year old female Height:      67 inches Weight:      186.4 pounds Pulse rate:   70 / minute BP sitting:   150 / 94  Vitals Entered By: Shary Decamp (October 18, 2009 8:24 AM) CC: anxiety   History of Present Illness: lost her father few days ago Dorene Sorrow, one of my patients)  very distress  long h/o insomnia, worse lately no suicidal   Current Medications (verified): 1)  Valtrex 500 Mg Tabs (Valacyclovir Hcl) .Marland Kitchen.. 1 By Mouth Two Times A Day X 3 Days For Each Episode of Herpes 2)  Klonopin 0.5 Mg Tabs (Clonazepam) .Marland Kitchen.. 1 By Mouth Three Times A Day  Allergies (verified): 1)  ! Codeine 2)  ! Cipro 3)  ! Levaquin 4)  ! Percocet 5)  ! * Bee Stings  Past History:  Past Medical History: Reviewed history from 05/08/2009 and no changes required. 1999 kidney failure (rt kidney functions at 40%), sees Urology routinely  Alexis Goodell) kidney stones, recurrent G4 P2,  abortion x 1 miscarriage x 1 scarlet fever x 3 h/o uterine ablation aprox 2004-- had prolonged / heavy periods  (gyn Dr Lily Peer) NO h/o abnormal PAPs Anxiety  Past Surgical History: Reviewed history from 11/03/2007 and no changes required. Caesarean section Tonsillectomy Tubal ligation  Social History: Reviewed history from 05/08/2009 and no changes required. 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   Psych:  Oriented X3 and good eye contact.  tearful, emotional, anxious and depress but seems that she is able to control herself    Impression & Recommendations:  Problem # 1:  ANXIETY (ICD-300.00)  chronic anxiety and insomnia recently exacerbated by of her father death also depressed patient is  counseled today she just started clonazepam yesterday, does not make her sleepy, make her slightly less emotional she has a history of SSRI intolerance ( including suicidal ideas), also intolerant to over-the-counter sleep aids like Tylenol PM Plan: Continue Klonopin nortriptyline to help with insomnia, patient to call if side effects if intolerant, consider other sleep aids refer to [place]  counseling understanding that cost  may be an issue   Her updated medication list for this problem includes:    Klonopin 0.5 Mg Tabs (Clonazepam) .Marland Kitchen... 1 by mouth three times a day    Nortriptyline Hcl 25 Mg Caps (Nortriptyline hcl) .Marland Kitchen... 1 or 2 by mouth at bedtime as needed  insomnia  Orders: Misc. Referral (Misc. Ref)  Problem # 2:  face-to-face 18 minutes, > 50% spent in counseling  Complete Medication List: 1)  Valtrex 500 Mg Tabs (Valacyclovir hcl) .Marland Kitchen.. 1 by mouth two times a day x 3 days for each episode of herpes 2)  Klonopin 0.5 Mg Tabs (Clonazepam) .Marland Kitchen.. 1 by mouth three times a day 3)  Nortriptyline Hcl 25 Mg Caps (Nortriptyline hcl) .Marland Kitchen.. 1 or 2 by mouth at bedtime as needed  insomnia  Patient Instructions: 1)  Please schedule a follow-up appointment in 2 weeks.  Prescriptions: NORTRIPTYLINE HCL 25 MG CAPS (NORTRIPTYLINE HCL) 1 or 2 by mouth at bedtime as needed  insomnia  #30 x 0   Entered and  Authorized by:   Nolon Rod Chasity Outten MD   Signed by:   Nolon Rod. Edon Hoadley MD on 10/18/2009   Method used:   Print then Give to Patient   RxID:   [id number]

## 2010-09-13 NOTE — Assessment & Plan Note (Signed)
Summary: sinus infection//ear pain//lch   Vital Signs:  Patient profile:   40 year old female Weight:      160.50 pounds Temp:     98.6 degrees F oral Pulse rate:   80 / minute Pulse rhythm:   regular BP sitting:   122 / 80  (left arm) Cuff size:   large  Vitals Entered By: Army Fossa CMA (May 30, 2010 11:14 AM) CC: Pt here c/o sinus pressure  Comments Nasal congestion  Ear pain Started on sunday  [address]    History of Present Illness: developed sinus pressure and congestion 4 days ago along with cough Later on started with some hoarseness Yesterday developed right ear pain without discharge Taking over-the-counter "Aleve sinus" and NyQuil  ROS overall she feels better today No fever No nausea or vomiting 2 days ago had mild   myalgias which are now resolved mild, yellow sputum production  Current Medications (verified): 1)  Valtrex 500 Mg Tabs (Valacyclovir Hcl) .Marland Kitchen.. 1 By Mouth Two Times A Day X 3 Days For Each Episode of Herpes  Allergies: 1)  ! Codeine 2)  ! Cipro 3)  ! Levaquin 4)  ! Percocet 5)  ! Nortriptyline Hcl 6)  ! * Bee Stings  Social History: Reviewed history from 05/11/2010 and no changes required. 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   Head:  face symmetric, nontender to palpation Ears:  TMs slightly bulged bilaterally, no red Nose:  not congested Mouth:  no redness or discharge Lungs:  normal respiratory effort, no intercostal retractions, no accessory muscle use,  few rhonchi with cough. No wheezing or crackles   Impression & Recommendations:  Problem # 1:  URI (ICD-465.9) URI symptoms for 4 days, overall feels better today. No evidence of ear infection which was the  patient's main concern. She does have some fluid buildup in the ear. See instructions, if not better, will need antibiotics  Problem # 2:  URINARY TRACT INFECTION, RECURRENT (ICD-599.0)  had a UTI recently, now asymptomatic Urinalysis today negative  The following medications were removed from the medication list:    Ceftin 500 Mg Tabs (Cefuroxime axetil) .Marland Kitchen... 1 by mouth two times a day x 5 days  Orders: UA Dipstick w/o Micro (automated)  (81003)  Complete Medication List: 1)  Valtrex 500 Mg Tabs (Valacyclovir hcl) .Marland Kitchen.. 1 by mouth two times a day x 3 days for each episode of herpes  Patient Instructions: 1)  continue with OTCs for cough, congestion 2)  fluids 3)  call if no better next week, call if you feel worse    Orders Added: 1)  UA Dipstick w/o Micro (automated)  [81003] 2)  Est. Patient Level III [40981]    Laboratory Results   Urine Tests    Routine Urinalysis   Color: yellow Appearance: Clear Glucose: negative   (Normal Range: Negative) Bilirubin: negative   (Normal Range: Negative) Ketone: negative   (Normal Range: Negative) Spec. Gravity: 1.015   (Normal Range: 1.003-1.035) Blood: negative   (Normal Range: Negative) pH: 7.0   (Normal Range: 5.0-8.0) Protein: negative   (Normal Range: Negative) Urobilinogen: 0.2   (Normal Range: 0-1) Nitrite: negative   (Normal Range: Negative) Leukocyte Esterace: negative   (Normal Range: Negative)  Comments: Army Fossa CMA  May 30, 2010 11:19 AM

## 2010-09-13 NOTE — Assessment & Plan Note (Signed)
Summary: acute only - uti/swh   Vital Signs:  Patient profile:   40 year old female Height:      67 inches Weight:      183.8 pounds Temp:     98.2 degrees F BP sitting:   120 / 80  Vitals Entered By: Shary Decamp (September 13, 2009 1:42 PM) CC: acute only Comments  - urinary pressure, dark cloudy urine x several days Shary Decamp  September 13, 2009 1:45 PM    History of Present Illness: as above   Current Medications (verified): 1)  Valtrex 500 Mg Tabs (Valacyclovir Hcl) .Marland Kitchen.. 1 By Mouth Two Times A Day X 3 Days For Each Episode of Herpes  Allergies (verified): 1)  ! Codeine 2)  ! Cipro 3)  ! Levaquin 4)  ! Percocet 5)  ! * Bee Stings  Past History:  Past Medical History: Reviewed history from 05/08/2009 and no changes required. 1999 kidney failure (rt kidney functions at 40%), sees Urology routinely  Alexis Goodell) kidney stones, recurrent G4 P2,  abortion x 1 miscarriage x 1 scarlet fever x 3 h/o uterine ablation aprox 2004-- had prolonged / heavy periods  (gyn Dr Lily Peer) NO h/o abnormal PAPs Anxiety  Past Surgical History: Reviewed history from 11/03/2007 and no changes required. Caesarean section Tonsillectomy Tubal ligation  Social History: Reviewed history from 05/08/2009 and no changes required. 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 GI:  Denies nausea and vomiting; no flank pain. GU:  Denies hematuria.  Physical Exam  General:  alert and well-developed.   Abdomen:  soft, non-tender, no distention, no masses, no guarding, and no rigidity.  no CVA tenderness   Impression & Recommendations:  Problem # 1:  URINARY TRACT INFECTION, RECURRENT (ICD-599.0) symptoms consistent with a UTI, start antibiotics, urine culture pending she is allergic to Cipro and  Levaquin will call results to her cell phone-- [phone]    Her updated medication list for this problem includes:    Bactrim Ds 800-160 Mg Tabs (Sulfamethoxazole-trimethoprim) .Marland Kitchen... 1 by mouth two times a day  Orders: UA Dipstick w/o Micro (manual) (47829) Specimen Handling (99000) T-Urine Microscopic (56213-08657) T-Culture, Urine (84696-29528)  Complete Medication List: 1)  Valtrex 500 Mg Tabs (Valacyclovir hcl) .Marland Kitchen.. 1 by mouth two times a day x 3 days for each episode of herpes 2)  Bactrim Ds 800-160 Mg Tabs (Sulfamethoxazole-trimethoprim) .Marland Kitchen.. 1 by mouth two times a day 3)  Fluconazole 150 Mg Tabs (Fluconazole) .Marland Kitchen.. 1 by mouth once daily x 2 Prescriptions: FLUCONAZOLE 150 MG TABS (FLUCONAZOLE) 1 by mouth once daily x 2  #2 x 0   Entered and Authorized by:   Nolon Rod. Paz MD   Signed by:   Nolon Rod. Paz MD on 09/13/2009   Method used:   Electronically to        [id number] (retail)       [address]       [place], [place]  [address]       Ph: [phone] or [phone]       Fax: [phone]   RxID:   [id number] BACTRIM DS 800-160 MG TABS (SULFAMETHOXAZOLE-TRIMETHOPRIM) 1 by mouth two times a day  #10 x 0   Entered  and Authorized by:   Nolon Rod. Paz MD   Signed by:   Nolon Rod. Paz MD on 09/13/2009   Method used:   Electronically to        [id number] (retail)       [address]       [place], [place]  [address]       Ph: [phone] or [phone]       Fax: [phone]   RxID:   [id number]   Laboratory Results   Urine Tests    Routine Urinalysis   Color: amber Appearance: Cloudy Glucose: negative   (Normal Range: Negative) Bilirubin: negative   (Normal Range: Negative) Ketone: negative   (Normal Range: Negative) Spec. Gravity: 1.025   (Normal Range: 1.003-1.035) Blood: large   (Normal Range: Negative) pH: 5.0   (Normal Range: 5.0-8.0) Protein: negative   (Normal Range:  Negative) Urobilinogen: 0.2   (Normal Range: 0-1) Nitrite: positive   (Normal Range: Negative) Leukocyte Esterace: large   (Normal Range: Negative)

## 2010-10-30 ENCOUNTER — Ambulatory Visit (INDEPENDENT_AMBULATORY_CARE_PROVIDER_SITE_OTHER): Payer: BC Managed Care – PPO | Admitting: Gynecology

## 2010-10-30 ENCOUNTER — Other Ambulatory Visit: Payer: Self-pay | Admitting: Gynecology

## 2010-10-30 ENCOUNTER — Other Ambulatory Visit (HOSPITAL_COMMUNITY)
Admission: RE | Admit: 2010-10-30 | Discharge: 2010-10-30 | Disposition: A | Discharge status: Home / Self Care | Payer: BC Managed Care – PPO | Source: Ambulatory Visit | Attending: Gynecology | Admitting: Gynecology

## 2010-10-30 DIAGNOSIS — Z13220 Encounter for screening for lipoid disorders: Secondary | ICD-10-CM

## 2010-10-30 DIAGNOSIS — R635 Abnormal weight gain: Secondary | ICD-10-CM

## 2010-10-30 DIAGNOSIS — Z124 Encounter for screening for malignant neoplasm of cervix: Secondary | ICD-10-CM | POA: Insufficient documentation

## 2010-10-30 DIAGNOSIS — Z01419 Encounter for gynecological examination (general) (routine) without abnormal findings: Secondary | ICD-10-CM

## 2010-10-30 DIAGNOSIS — B373 Candidiasis of vulva and vagina: Secondary | ICD-10-CM

## 2010-11-07 ENCOUNTER — Ambulatory Visit (INDEPENDENT_AMBULATORY_CARE_PROVIDER_SITE_OTHER): Payer: BC Managed Care – PPO | Admitting: Internal Medicine

## 2010-11-07 ENCOUNTER — Encounter: Payer: Self-pay | Admitting: Internal Medicine

## 2010-11-07 VITALS — BP 126/84 | HR 70 | Wt 171.6 lb

## 2010-11-07 DIAGNOSIS — N390 Urinary tract infection, site not specified: Secondary | ICD-10-CM

## 2010-11-07 LAB — POCT URINALYSIS DIPSTICK
Bilirubin, UA: NEGATIVE
Blood, UA: NEGATIVE
Ketones, UA: NEGATIVE
Leukocytes, UA: NEGATIVE
Protein, UA: NEGATIVE
pH, UA: 7

## 2010-11-07 MED ORDER — FLUCONAZOLE 150 MG PO TABS: 150.0000 mg | ORAL_TABLET | Freq: Once | ORAL | Status: AC

## 2010-11-07 MED ORDER — SULFAMETHOXAZOLE-TRIMETHOPRIM 800-160 MG PO TABS: 1.0000 | ORAL_TABLET | Freq: Two times a day (BID) | ORAL | Status: DC

## 2010-11-07 MED ORDER — CEFUROXIME AXETIL 500 MG PO TABS: 500.0000 mg | ORAL_TABLET | Freq: Two times a day (BID) | ORAL | Status: AC

## 2010-11-07 NOTE — Patient Instructions (Signed)
Lots of fluids Call if no better in few days or if no better

## 2010-11-07 NOTE — Progress Notes (Signed)
  Subjective:    Patient ID: [name], female    DOB: 1970/08/26, 39 y.o.   MRN: [id number]  HPI  1 day h/o pressure and urgency, h/o frecuent UTIs, last one was few months ago   Review of Systems No F, no flank pain No dysuria perse, no gross hematuria  Past Medical History  Diagnosis Date  . Kidney failure 1999    rt kidney functions @ 40%, sees urology routinely (Dr.Peterson), kidney stones, recurrent  . Abortion history     x1  . Miscarriage     x1   . Scarlet fever     x3  . Abnormal vaginal Pap smear   . Anxiety    Past Surgical History  Procedure Date  . Cesarean section   . Tonsillectomy   . Tubal ligation    History   Social History  . Marital Status: Married    Spouse Name: N/A    Number of Children: 2  . Years of Education: N/A   Occupational History  . [employer]   Tax adviser of facilities   .  [employer]   Social History Main Topics  . Smoking status: Former Smoker -- 0.5 packs/day    Quit date: 10/20/2010  . Smokeless tobacco: Not on file  . Alcohol Use: Yes     rarely  . Drug Use: Yes     marjiuanna   . Sexually Active: Not on file   Other Topics Concern  . Not on file   Social History Narrative   Mom is [name] one of my patientsLost father [name] 3/11      Objective:   Physical Exam  Constitutional: She appears well-developed and well-nourished.  Cardiovascular: Normal rate, regular rhythm and normal heart sounds.   Pulmonary/Chest: Effort normal and breath sounds normal. No respiratory distress.  Abdominal: Soft. Bowel sounds are normal. She exhibits no distension. There is no tenderness. There is no rebound and no guarding.       No  CVA tenderness           Assessment & Plan:

## 2010-11-07 NOTE — Assessment & Plan Note (Addendum)
Sx c/w UTI Udip neg UCX pending Start abx Also diflucan in  case she develops a yeast infx Addendum, pt prefers ceftin: done

## 2010-11-08 ENCOUNTER — Encounter: Payer: Self-pay | Admitting: Internal Medicine

## 2010-11-09 LAB — URINE CULTURE

## 2010-11-12 ENCOUNTER — Other Ambulatory Visit: Payer: BC Managed Care – PPO

## 2010-11-12 ENCOUNTER — Encounter: Payer: Self-pay | Admitting: Internal Medicine

## 2010-11-12 ENCOUNTER — Other Ambulatory Visit (INDEPENDENT_AMBULATORY_CARE_PROVIDER_SITE_OTHER): Payer: BC Managed Care – PPO

## 2010-11-12 ENCOUNTER — Telehealth: Payer: Self-pay | Admitting: *Deleted

## 2010-11-12 ENCOUNTER — Ambulatory Visit (INDEPENDENT_AMBULATORY_CARE_PROVIDER_SITE_OTHER): Payer: BC Managed Care – PPO | Admitting: Gynecology

## 2010-11-12 ENCOUNTER — Other Ambulatory Visit: Payer: Self-pay | Admitting: Gynecology

## 2010-11-12 DIAGNOSIS — N938 Other specified abnormal uterine and vaginal bleeding: Secondary | ICD-10-CM

## 2010-11-12 DIAGNOSIS — E7800 Pure hypercholesterolemia, unspecified: Secondary | ICD-10-CM

## 2010-11-12 DIAGNOSIS — N831 Corpus luteum cyst of ovary, unspecified side: Secondary | ICD-10-CM

## 2010-11-12 DIAGNOSIS — N949 Unspecified condition associated with female genital organs and menstrual cycle: Secondary | ICD-10-CM

## 2010-11-12 DIAGNOSIS — Z1371 Encounter for nonprocreative screening for genetic disease carrier status: Secondary | ICD-10-CM

## 2010-11-12 HISTORY — DX: Encounter for nonprocreative screening for genetic disease carrier status: Z13.71

## 2010-11-12 NOTE — Telephone Encounter (Signed)
Message left for patient to return my call.  

## 2010-11-12 NOTE — Telephone Encounter (Signed)
Message copied by Army Fossa on Mon Nov 12, 2010 10:16 AM ------      Message from: Willow Ora      Created: Sat Nov 10, 2010  2:59 PM       Advise patient:      Urine culture positive, continue with antibiotics, call if no better

## 2010-11-12 NOTE — Telephone Encounter (Signed)
Spoke w/ pt aware of information.  

## 2010-11-22 ENCOUNTER — Ambulatory Visit (INDEPENDENT_AMBULATORY_CARE_PROVIDER_SITE_OTHER): Payer: BC Managed Care – PPO | Admitting: Gynecology

## 2010-11-22 DIAGNOSIS — R1031 Right lower quadrant pain: Secondary | ICD-10-CM

## 2010-11-22 DIAGNOSIS — N949 Unspecified condition associated with female genital organs and menstrual cycle: Secondary | ICD-10-CM

## 2010-11-22 DIAGNOSIS — N938 Other specified abnormal uterine and vaginal bleeding: Secondary | ICD-10-CM

## 2010-11-29 ENCOUNTER — Other Ambulatory Visit (HOSPITAL_COMMUNITY): Payer: Self-pay | Admitting: Urology

## 2010-11-29 DIAGNOSIS — N289 Disorder of kidney and ureter, unspecified: Secondary | ICD-10-CM

## 2010-11-29 DIAGNOSIS — N200 Calculus of kidney: Secondary | ICD-10-CM

## 2010-12-03 ENCOUNTER — Other Ambulatory Visit: Payer: BC Managed Care – PPO

## 2010-12-04 ENCOUNTER — Encounter (HOSPITAL_COMMUNITY): Payer: Self-pay

## 2010-12-04 ENCOUNTER — Encounter (HOSPITAL_COMMUNITY)
Admission: RE | Admit: 2010-12-04 | Discharge: 2010-12-04 | Disposition: A | Discharge status: Home / Self Care | Payer: BC Managed Care – PPO | Source: Ambulatory Visit | Attending: Urology | Admitting: Urology

## 2010-12-04 DIAGNOSIS — N289 Disorder of kidney and ureter, unspecified: Secondary | ICD-10-CM

## 2010-12-04 DIAGNOSIS — R944 Abnormal results of kidney function studies: Secondary | ICD-10-CM | POA: Insufficient documentation

## 2010-12-04 DIAGNOSIS — N200 Calculus of kidney: Secondary | ICD-10-CM

## 2010-12-04 DIAGNOSIS — Z87442 Personal history of urinary calculi: Secondary | ICD-10-CM | POA: Insufficient documentation

## 2010-12-04 MED ORDER — TECHNETIUM TC 99M MERTIATIDE
15.2000 | Freq: Once | INTRAVENOUS | Status: AC | PRN
  Administered 2010-12-04: 15.2 via INTRAVENOUS

## 2011-01-25 ENCOUNTER — Other Ambulatory Visit: Payer: Self-pay | Admitting: Gynecology

## 2011-01-25 ENCOUNTER — Institutional Professional Consult (permissible substitution) (INDEPENDENT_AMBULATORY_CARE_PROVIDER_SITE_OTHER): Payer: BC Managed Care – PPO | Admitting: Gynecology

## 2011-01-25 ENCOUNTER — Encounter (HOSPITAL_COMMUNITY): Payer: BC Managed Care – PPO

## 2011-01-25 DIAGNOSIS — Z01818 Encounter for other preprocedural examination: Secondary | ICD-10-CM

## 2011-01-25 LAB — COMPREHENSIVE METABOLIC PANEL
ALT: 24 U/L (ref 0–35)
Albumin: 4 g/dL (ref 3.5–5.2)
Alkaline Phosphatase: 73 U/L (ref 39–117)
GFR calc Af Amer: >60 mL/min (ref >60)
Glucose, Bld: 92 mg/dL (ref 70–99)
Potassium: 3.8 mEq/L (ref 3.5–5.1)
Sodium: 136 mEq/L (ref 135–145)
Total Protein: 7.7 g/dL (ref 6.0–8.3)

## 2011-01-25 LAB — URINALYSIS, ROUTINE W REFLEX MICROSCOPIC
Glucose, UA: NEGATIVE mg/dL
Hgb urine dipstick: NEGATIVE
Specific Gravity, Urine: 1.025 (ref 1.005–1.030)
Urobilinogen, UA: 1 mg/dL (ref 0.0–1.0)

## 2011-01-25 LAB — CBC
HCT: 42.3 % (ref 36.0–46.0)
Hemoglobin: 14.6 g/dL (ref 12.0–15.0)
RBC: 4.68 MIL/uL (ref 3.87–5.11)
RDW: 13 % (ref 11.5–15.5)
WBC: 11.1 10*3/uL — ABNORMAL HIGH (ref 4.0–10.5)

## 2011-01-31 ENCOUNTER — Ambulatory Visit (HOSPITAL_COMMUNITY)
Admission: RE | Admit: 2011-01-31 | Discharge: 2011-02-01 | Disposition: A | Discharge status: Home / Self Care | Payer: BC Managed Care – PPO | Source: Ambulatory Visit | Attending: Gynecology | Admitting: Gynecology

## 2011-01-31 ENCOUNTER — Other Ambulatory Visit: Payer: Self-pay | Admitting: Gynecology

## 2011-01-31 DIAGNOSIS — N920 Excessive and frequent menstruation with regular cycle: Secondary | ICD-10-CM

## 2011-01-31 DIAGNOSIS — N946 Dysmenorrhea, unspecified: Secondary | ICD-10-CM

## 2011-01-31 DIAGNOSIS — N949 Unspecified condition associated with female genital organs and menstrual cycle: Secondary | ICD-10-CM

## 2011-01-31 DIAGNOSIS — N938 Other specified abnormal uterine and vaginal bleeding: Secondary | ICD-10-CM | POA: Insufficient documentation

## 2011-01-31 DIAGNOSIS — R1031 Right lower quadrant pain: Secondary | ICD-10-CM | POA: Insufficient documentation

## 2011-01-31 DIAGNOSIS — Z01818 Encounter for other preprocedural examination: Secondary | ICD-10-CM | POA: Insufficient documentation

## 2011-01-31 DIAGNOSIS — Z01812 Encounter for preprocedural laboratory examination: Secondary | ICD-10-CM | POA: Insufficient documentation

## 2011-01-31 HISTORY — PX: LAPAROSCOPIC TOTAL HYSTERECTOMY: SUR800

## 2011-02-01 ENCOUNTER — Observation Stay (HOSPITAL_COMMUNITY)
Admission: AD | Admit: 2011-02-01 | Discharge: 2011-02-02 | Disposition: A | Discharge status: Home / Self Care | Payer: BC Managed Care – PPO | Source: Ambulatory Visit | Attending: Gynecology | Admitting: Gynecology

## 2011-02-01 ENCOUNTER — Inpatient Hospital Stay (HOSPITAL_COMMUNITY): Payer: BC Managed Care – PPO

## 2011-02-01 DIAGNOSIS — T40605A Adverse effect of unspecified narcotics, initial encounter: Secondary | ICD-10-CM | POA: Insufficient documentation

## 2011-02-01 DIAGNOSIS — R109 Unspecified abdominal pain: Secondary | ICD-10-CM | POA: Insufficient documentation

## 2011-02-01 DIAGNOSIS — R1110 Vomiting, unspecified: Principal | ICD-10-CM | POA: Insufficient documentation

## 2011-02-01 LAB — CBC
HCT: 35.8 % — ABNORMAL LOW (ref 36.0–46.0)
HCT: 35.9 % — ABNORMAL LOW (ref 36.0–46.0)
Hemoglobin: 11.8 g/dL — ABNORMAL LOW (ref 12.0–15.0)
Hemoglobin: 12.3 g/dL (ref 12.0–15.0)
MCH: 30.6 pg (ref 26.0–34.0)
MCHC: 34.3 g/dL (ref 30.0–36.0)
MCV: 91.1 fL (ref 78.0–100.0)
MCV: 89.3 fL (ref 78.0–100.0)
RBC: 3.93 MIL/uL (ref 3.87–5.11)
WBC: 11.2 10*3/uL — ABNORMAL HIGH (ref 4.0–10.5)

## 2011-02-01 LAB — COMPREHENSIVE METABOLIC PANEL
BUN: 9 mg/dL (ref 6–23)
Calcium: 8.8 mg/dL (ref 8.4–10.5)
GFR calc Af Amer: >60 mL/min (ref >60)
GFR calc non Af Amer: >60 mL/min (ref >60)
Glucose, Bld: 99 mg/dL (ref 70–99)
Total Protein: 6.5 g/dL (ref 6.0–8.3)

## 2011-02-02 ENCOUNTER — Inpatient Hospital Stay (HOSPITAL_COMMUNITY): Payer: BC Managed Care – PPO

## 2011-02-02 MED ORDER — IOHEXOL 300 MG/ML  SOLN
100.0000 mL | Freq: Once | INTRAMUSCULAR | Status: AC | PRN
  Administered 2011-02-02: 100 mL via INTRAVENOUS

## 2011-02-04 NOTE — Op Note (Signed)
[name], [name]               ACCOUNT NO.:  [id number]  MEDICAL RECORD NO.:  [id number]  LOCATION:  9313                          FACILITY:  [facility]  PHYSICIAN:  [name], M.D.DATE OF BIRTH:  1970/08/24  DATE OF PROCEDURE:  01/31/2011 DATE OF DISCHARGE:                              OPERATIVE REPORT   SURGEON:  [name], MD  FIRST ASSISTANT:  Timothy P. Fontaine, MD  INDICATIONS FOR OPERATION:  A 40 year old gravida 3, para 2, AB 1, with refractory dysmenorrhea and menorrhagia and right lower quadrant pain, scheduled for total laparoscopic hysterectomy and possible right salpingo-oophorectomy.  PREOPERATIVE DIAGNOSES: 1. Dysmenorrhea. 2. Menorrhagia. 3. Chronic pelvic pain.  POSTOPERATIVE DIAGNOSES: 1. Dysmenorrhea. 2. Menorrhagia. 3. Chronic pelvic pain.  ANESTHESIA:  General endotracheal anesthesia.  PROCEDURE PERFORMED:  Total laparoscopic hysterectomy.  FINDINGS:  The patient with evidence of normal-appearing ovaries, uterus, and cervix, smooth liver surface, smooth gallbladder surface, appendix not visualized.  DESCRIPTION OF OPERATION:  After the patient was adequately counseled, she was taken to the operating room where she underwent successful general endotracheal anesthesia.  The patient has had cefotetan 1 g IV for prophylaxis and also had PSA stockings for DVT prophylaxis.  The patient, after successful general endotracheal anesthesia, was placed in low lithotomy position.  The abdomen, vagina, and perineum were prepped and draped in usual sterile fashion but prior to this, a bimanual examination demonstrated upper limits of normal uterus with no palpable adnexal masses.  A Graves speculum was inserted into the vaginal vault. The cervix was grasped with a single-tooth tenaculum.  The uterus sounded to 10 cm and the RUMI uterine manipulator with the appropriate tip size and KOH cup was applied for manipulation during the laparoscopic  procedure.  The speculum had been removed.  The Foley catheter had been placed to monitor urinary output and a drape was then place.  A small semilunar incision was made underneath the umbilicus.  A 10/11 mm OptiView trocar was inserted into the peritoneal cavity and approximately 3 liters of carbon dioxide were insufflated in to the pelvic cavity and to the peritoneal cavity, and the patient was then placed in Trendelenburg position.  Under laparoscopic guidance, 2 additional ports were made on the right and left of the patient's lower abdomen for a 10-mm trocar to be inserted for assistance during the operation.  A systematic inspection of the uterus demonstrated a normal- sized uterus with normal-appearing ovaries and evidence of prior tubal transection, anterior and posterior cul-de-sac were free of adhesions or endometriotic implants, smooth liver surface, smooth gallbladder surface, and the appendix was not visualized.  Attention was then placed at the proximal right uteroovarian ligament which was coapted and transected with Harmonic scalpel as was the proximal fallopian tube and the right round ligament and then the remainder of the broad and cardinal ligaments were serially coapted and transected with the harmonic scalpel to the level of the right vaginal fornix.  Similar procedure was carried out on the contralateral side.  Both uterine arteries had been identified but with Kleppinger forceps, they were cauterized and then coapted and transected with Harmonic scalpel.  The anterior peritoneum was peeled off from the lower  uterine segment and then the cervicovaginal region, anterior fornix was incised with the back side of the harmonic scalpel, scoring the KOH cup in a circumferential manner to excise the cervix from the vaginal fornix circumferentially.  Once this was accomplished, the cervix and uterus was passed off the operative field for a pneumoperitoneum.  A bulb syringe  was placed in the vagina.  The pneumoperitoneum was reestablished.  The pelvic cavity was copiously irrigated with normal saline solution and the vaginal cuff was closed with an Endo Stitch of 0 Vicryl suture in figure-of-eight fashion to close the vaginal cuff.  The pelvic cavity was then copiously irrigated with normal saline solution. The pneumoperitoneum was released in an effort to visualize any potential bleeders.  The operative field was completely dry as were both pedicles and the pneumoperitoneum was released and instruments were removed.  The patient was reversed from Trendelenburg position.  The subumbilical fascia was closed with a single figure-of-eight suture and all 3 skin sites were closed with Dermabond glue and a 0.25% Marcaine was infiltrated in all 3 port sites for a total of 10 mL.  The bulb syringe had been placed in the vagina for pneumoperitoneum.  Trocars were removed.  The patient was extubated, transferred to recovery room with stable vital signs.  Blood loss from procedure was less than 100 mL, IV fluids 1700 mL of lactated Ringer's.  Urine output 150 mL and for postoperative analgesia, at all 3 port sites, 0.25% Marcaine was infiltrated for a total of 10 mL.     [name], M.D.     JHF/MEDQ  D:  01/31/2011  T:  02/01/2011  Job:  [id number]  Electronically Signed by Reynaldo Minium M.D. on 02/04/2011 08:45:58 AM

## 2011-02-04 NOTE — Discharge Summary (Signed)
  [name], [name]               ACCOUNT NO.:  [id number]  MEDICAL RECORD NO.:  [id number]  LOCATION:  [facility]                         [facility]  PHYSICIAN:  Vallorie Niccoli H. Lily Peer, M.D.DATE OF BIRTH:  09-17-1970  DATE OF ADMISSION:  01/31/2011 DATE OF DISCHARGE:  02/01/2011                              DISCHARGE SUMMARY   HISTORY:  The patient is a 40 year old with history of dysmenorrhea and menorrhagia and chronic pelvic pain who underwent a total laparoscopic hysterectomy on the morning of January 31, 2011.  Findings demonstrated evidence of prior tubal sterilization procedure, normal uterus, tubes, and ovaries, smooth liver surface and gallbladder, and appendix was not seen.  Intraoperatively, her blood loss was less than 100 mL and postoperatively she did well.  She was up ambulating that evening and tolerated her liquids and she tolerated regular diet this morning.  Her hemoglobin was 11.8, platelet count 157,000, and O2 sats on room air were 98%.  Her abdomen was soft, nontender, no rebound or guarding. Port sites intact.  Positive bowel sounds in all 4 quadrants and she was ready to be discharged home.  FINAL DIAGNOSES: 1. Dysmenorrhea. 2. Menorrhagia. 3. Chronic pelvic pain.  PROCEDURE PERFORMED:  Total laparoscopic hysterectomy.  FINAL DISPOSITION AND FOLLOWUP:  The patient will be discharged home within 24 hours from her surgery.  She was up ambulating, tolerating regular diet well, had stable vital signs, was afebrile, and had a hemoglobin of 11.8.  The patient was discharged home on Demerol 50 mg 1 p.o. q.4-6 h. p.r.n., Reglan 10 mg 1 p.o. q.4-6 h. p.r.n. nausea and vomiting and she is scheduled for postop appointment in 2 weeks. Discharge instructions were provided.     Miria Cappelli H. Lily Peer, M.D.     JHF/MEDQ  D:  02/01/2011  T:  02/02/2011  Job:  [id number]  Electronically Signed by Reynaldo Minium M.D. on 02/04/2011 08:46:00 AM

## 2011-02-04 NOTE — Discharge Summary (Signed)
NAMECHANNAH, GODEAUX               ACCOUNT NO.:  [id number]  MEDICAL RECORD NO.:  [id number]  LOCATION:  9318                          [facility]ILITY:  [facility]  PHYSICIAN:  [name], M.D.DATE OF BIRTH:  [birth year]  DATE OF ADMISSION:  02/01/2011 DATE OF DISCHARGE:  02/02/2011                              DISCHARGE SUMMARY   HISTORY OF PRESENT ILLNESS:  The patient is a 40 year old who was admitted within 24 hours to [facility] as a result of nausea, vomiting, and abdominal cramping.  She is status post total laparoscopic hysterectomy on January 31, 2011, uneventful and was discharged home on the morning of February 01, 2011, the patient has a multitude of allergies and had been discharged with Demerol of 50 mg one every 4-6 hours as needed, and Reglan 10 mg p.o. q.4-6 h. p.r.n. nausea or vomiting.  Before she left the hospital on January 31, 2011, she had taken a 50-mg tablet of Demerol en route to home [place] when she arrived, she began vomiting and had some salty crackers to help settle her stomach, 3 hours later she took another Demerol and it caused her vomiting once again and she took some liquids and still vomited, took another Demerol 3 hours after that with the Reglan and began vomiting and caused her afterwards have abdominal cramping and was taken to the emergency room at [facility]  On arrival, her temperature was 98.4, pulse 68, respirations 18, blood pressure 179/80, hemoglobin/hematocrit at the time of discharge on February 01, 2011, she had a hemoglobin of 11.8, hematocrit 35.8, platelet count of 157,000, white count of 11.2 on arrival to the emergency room on the evening of February 01, 2011, at 10 p.m. her hemoglobin/hematocrit were 12.3 and 35.9 respectively.  The white blood count of 11.0 and a platelet count 165,000.  Comprehensive metabolic panel, renal function test were all normal as were electrolytes.  No evidence of electrolyte  imbalance.  Her CT with contrast essentially unremarkable, only expected postsurgical changes in the pelvis, a small amount of nonspecific free fluid which was attributed to the pelvic washings and irrigation during her surgery and tiny collapse right ovarian cyst was documented and also a thickened gallbladder.  But no gallstones, it appears that the patient has now to add Demerol to her list of narcotics and other medications which she is allergic to.  It appears that she had suffered a reaction to the Demerol.  She was kept with IV fluids in the hospital close to 24-hour period and was started on liquid diet and then advanced to regular diet. She remained asymptomatic in the hospital.  She had received Ultram 100 mg one every 6 hours to see how she tolerate it here in the hospital before being discharged home.  She was up and ambulating, tolerating regular diet and had a bowel movement and passed gas and her vital signs continued to remain stable one isolated episode of blood pressure diastolic 90s, but the remainder of the time she has been normotensive. Her abdomen was soft, nontender, no rebound, or guarding in all 4 quadrants.  Positive bowel sounds were noted, port sites were intact, no CVA tenderness,  and she remained afebrile and was ready to be discharged home.  ASSESSMENT: 1. Status post total laparoscopic hysterectomy on January 31, 2011,     discharged home on February 01, 2011. 2. Readmitted on February 01, 2011, nausea, vomiting, abdominal cramping. 3. Allergic reaction to Demerol.  PROCEDURE PERFORMED: 1. Observation. 2. IV hydration 3. CT with contrast. 4. Change of pain medication for postoperative analgesia.  FINAL DISPOSITION/FOLLOWUP:  The patient was discharged home after being re-admitted and observed.  She did well on the Ultram 100 mg q.6 h. p.r.n. and will be discharged home.  She was ambulating, voiding well, and tolerating regular diet well and was afebrile with  stable vital signs.  She will be seen in the office in 2 weeks for followup postop visit.     [name], M.D.     JHF/MEDQ  D:  02/02/2011  T:  02/03/2011  Job:  [id number]  Electronically Signed by Reynaldo Minium M.D. on 02/04/2011 08:46:30 AM

## 2011-02-04 NOTE — H&P (Signed)
[name], [name]               ACCOUNT NO.:  [id number]  MEDICAL RECORD NO.:  [id number]  LOCATION:  9318                          FACILITY:  [facility]  PHYSICIAN:  Lynna Zamorano H. Lily Peer, M.D.DATE OF BIRTH:  06-01-1971  DATE OF ADMISSION:  02/01/2011 DATE OF DISCHARGE:                             HISTORY & PHYSICAL   HISTORY:  The patient is a 40 year old who is status post total laparoscopic hysterectomy on January 31, 2011 uneventful and was discharged home on the morning of June 22.  The patient with a multitude of allergies was discharged home with Demerol 50 mg to take 1 p.o. q.4-6 hours p.r.n. and Reglan 10 mg 1 p.o. q.4-6 hours p.r.n. nausea or vomiting.  Before she left the hospital in the morning of June 21,  she took 50 mg Demerol tablet en route to her home in [place], [place] and when she arrived, she began vomiting and ate some salty crackers and settled her stomach.  Three hours later, she took another Demerol, caused her to vomit once again.  She took some liquids, still vomited, and took another Demerol and at this time with Reglan 3 hours later and once again after taking the Demerol, she vomited again and from the retching it caused her to have abdominal discomfort and husband brought her to the emergency room at [facility].  My colleague, Dr. Nicholas Lose had evaluated her and had requested that due to her abdominal tenderness, nausea, and vomiting that she have a CT with contrast.  On arrival, her temperature was 98.4, pulse was 68, respiration 18, and blood pressure 179/80.  Her hemoglobin/hematocrit at the time of her discharge on June 22, she had a hemoglobin of 11.8, hematocrit 35.8, platelet count 157,000, white blood count 11.2.  On arrival to the emergency room on the evening of June 22 at 10:00 p.m., her hemoglobin/hematocrit were 12.3 and 35.9 with a white blood count of 11.0 and platelet count of 165,000.  Her comprehensive metabolic panel, renal  function tests were all normal and so was her electrolytes.  No evidence of electrolytes imbalance and her CT with contrast essentially unremarkable.  Expected postsurgical changes in the pelvis with small amount of nonspecific free fluid which is attributed to the pelvic washing and irrigation during the surgery and a tiny collapse right ovarian cyst and it was documented she had a thickened gallbladder wall with pericholecystic fluid, although the radiologist, cannot exclude acute cholecystitis with this setting and recommended followup ultrasound for assessment of the gallbladder for further evaluation. The patient spent the night in the hospital with IV hydration and for observation.  PAST MEDICAL HISTORY:  The patient is allergic to CODEINE, LEVAQUIN CIPRO, NORTRIPTYLINE, OXYCODONE in combination with ACETAMINOPHEN and now possibly DEMEROL.  PRIOR SURGERIES:  Endometrial ablation, 2 cesarean section with tubal ligation concurrently and most recently total laparoscopic hysterectomy.  MEDICATIONS:  Multivitamin and Valtrex p.r.n. for HSV and recently when she was discharged home with Demerol and Reglan.  OBSTETRICAL AND GYNECOLOGICAL HISTORY:  Two C-sections, last one with bilateral tubal sterilization procedure.  The patient had been tested for the BRCA1, BRCA2 mutation which were negative due to family history of breast  cancer.  MEDICAL HISTORY:  Also history of renal lithiasis of the right in the past, history of chronic cystitis.  FAMILY HISTORY:  Mother, grandmother, and aunt with history of breast cancer.  Hypertension in father and mother.  REVIEW OF SYSTEMS:  Significant from the gynecological standpoint that had attributed to her surgery as a result of her dysfunctional bleeding despite endometrial ablation and recent episode of nausea, vomiting which may be attributed to Demerol.  SOCIAL HISTORY:  The patient is a Psychiatric nurse.  The patient denies any alcohol use  but does smoke sparingly.  PHYSICAL EXAMINATION:  VITAL SIGNS:  This morning blood pressure 154/80, pulse 62, respirations 20, the patient is afebrile. HEENT:  Unremarkable. NECK:  Supple.  Trachea midline.  No carotid bruits, no thyromegaly. LUNGS:  Clear to auscultation.  No rhonchi or wheezes. HEART:  Regular rate and rhythm.  No murmurs or gallops. BREASTS:  Not done. ABDOMEN:  Soft, nontender.  No rebound or guarding.  Negative Murphy sign.  Positive bowel sounds in all 4 quadrants.  Incision ports intact. No vaginal bleeding. EXTREMITIES:  No cords or edema. RECTAL:  Deferred.  ASSESSMENT:  A 40 year old gravida 3, para 2, AB1 status post uneventful total laparoscopic hysterectomy for minimal blood loss from her surgery. She was discharged home within 24 hours and doing well.  It appears that based on the timing of her event that point she left the hospital, she had taken a Demerol tablet and began to vomit and 3 hours later took another Demerol with Reglan and again began to vomit 3 hours later and causing her to have abdominal discomfort from the vomiting and was brought to the emergency room.  Her white blood count appears to be decreasing from her postop discharge white blood count which was 11.2 and her hemoglobin/hematocrit remained stable and her platelet count and CT with contrast essentially unremarkable with the exception of a thickened gallbladder.  This morning on examination, the patient was alert, oriented.  Her abdomen was soft, nontender.  No rebound or guarding.  Negative Murphy's sign.  She had bowel sounds in all quadrants.  She had a bowel movement yesterday before she left the hospital, had been passing gas.  We are going to observe her the rest of the day and she was in no pain this morning.  We are going to give her a trial of Ultram 100 mg every 6 hours and see how she responds to that in case she may need it at home since she has multitude allergies  to Codeine, Oxycodone and as well now appears to DEMEROL.  We will start her on clear liquid diet and then advance as tolerated and possibly discharge home this afternoon or tomorrow morning.  Plan as per assessment above.     Maanya Hippert H. Lily Peer, M.D.     JHF/MEDQ  D:  02/02/2011  T:  02/02/2011  Job:  [id number]  Electronically Signed by Reynaldo Minium M.D. on 02/04/2011 08:46:06 AM

## 2011-02-12 NOTE — H&P (Addendum)
[name], [name]               ACCOUNT NO.:  [id number]  MEDICAL RECORD NO.:  [id number]  LOCATION:  SDC                           F[facility]ILITY:  [facility]  PHYSICIAN:  [name], M.D.DATE OF BIRTH:  December 05, 1970  DATE OF ADMISSION:  01/25/2011 DATE OF DISCHARGE:                             HISTORY & PHYSICAL   The patient is scheduled for surgery on Thursday, January 31, 2011, at 7:30 a.m. at [facility].  Please have history and physical available.  CHIEF COMPLAINT:  Dysfunctional uterine bleeding and right lower quadrant pain.  HISTORY:  The patient is a 40 year old gravida 3, para 2, AB 1, who had been seen on March 20 of this year for annual gynecological examination. She had brought up my attention that another provider in [place] had done an endometrial biopsy 7 years ago and she had been complaining of having 2 periods per month with a vaginal discharge and odor.  We had proceeded with her evaluation consisting of an endometrial biopsy which demonstrated a benign secretory endometrium.  No evidence of hyperplasia or malignancy and we had also had done a sonohysterogram on November 12, 2010, which demonstrated uterus that measured 9.5 x 5.8 x 4.3 cm with an endometrial stripe of 2.9 mm.  Left ovary had a small corpus luteum cyst measuring 26 x 19 mm with positive color flow and right ovary had a few follicles, otherwise normal.  During the sonohysterogram, the cavity was so narrow that we could not consider even placing a Mirena IUD to help with her dysfunctional uterine bleeding and she had side effects from oral contraceptive pills in the past and since she was having right lower quadrant discomfort on and off, we recommended proceeding with a total laparoscopic hysterectomy with possible right salpingo- oophorectomy.  She was seen today for her preoperative consultation.  We had also asked for a consultation with Dr. Larey Dresser who had seen the patient  in the past because of nephrolithiasis and he had ordered a renal scan on her recently which was done on December 04, 2010, which demonstrated mildly asymmetric renal function calculated at 54% on the left and 46% on the right.  Otherwise, normal exam and no evidence of collecting system obstruction and Dr. Larey Dresser cleared her from the urological standpoint.  To help with her bleeding, the patient had been kept on Megace 40 mg b.i.d. for 7-10 days and she is now ready for her surgery.  The patient is allergic to CODEINE, LEVAQUIN, CIPRO, NORTRIPTYLINE, OXYCODONE combination with ACETAMINOPHEN.  Prior surgeries have included endometrial ablation, 2 cesarean sections and tubal ligation concurrently.  Medications consist of multivitamin and Valtrex p.r.n.  OBSTETRICAL AND GYNECOLOGIC HISTORY:  Two C-sections, the last one with bilateral tubal sterilization procedure.  The patient is tested for the BRCA-1 and BRCA-2 mutation which were negative due to family history of breast cancer and the patient's history of HSV.  MEDICAL CONDITIONS: 1. History of renolithiasis on the right. 2. History of chronic cystitis.  FAMILY HISTORY:  Mother and grandmother and aunt with breast cancer, diabetes in father and mother, hypertension in father and mother, and ovarian cancer in great grandmother.  REVIEW OF  SYSTEMS:  Significant from the gynecological standpoint contributing to her dysfunctional bleeding despite prior endometrial ablation.  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  PHYSICAL EXAMINATION:  VITAL SIGNS:  The patient weighs 170 pounds, she is 5 feet 6.5 inches tall, BMI 27, blood pressure 126/78. HEENT:  Unremarkable. NECK:  Supple.  Trachea midline.  No carotid bruits.  No thyromegaly. LUNGS:  Clear to auscultation without rhonchi or wheezes. HEART:  Regular rate and rhythm.  No murmurs or  gallop. BREASTS:  Not done. ABDOMEN:  Soft, nontender.  No rebound or guarding. PELVIC:  Bartholin, urethra, Skene glands within normal limits.  Vagina and cervix, no gross lesions on inspection.  Uterus upper limits of normal.  Slight tenderness in the right adnexa, but no masses detected. RECTAL:  Deferred.  ASSESSMENT:  A 40 year old gravida 3, para 2, abortus 1 with refractory menometrorrhagia despite previous endometrial ablation and also complaining of on and off right lower quadrant discomfort.  We will proceed with a total laparoscopic hysterectomy with possible right salpingo-oophorectomy.  The risks, benefits, and pros and cons of the operation were discussed with the patient to include infection, although she will receive prophylaxis antibiotic, the risk for deep venous thrombosis, and subsequent pulmonary embolism were discussed.  She will have PSA stockings for prophylaxis as well.  Also in the event of any technical difficulty or internal bleeding or trauma to internal organs, an open laparotomy technique may need to be utilized to gain access to the abdominal cavity and complete the operation and correct any injury to internal organs which would require her to be in the hospital for several more days.  All these issues were discussed with the patient and all questions were answered and we will follow accordingly.  PLAN:  The patient is scheduled for total laparoscopic hysterectomy, possible right salpingo-oophorectomy on January 31, 2011, at 7:30 a.m. at [facility].  Please have history and physical available.     [name], M.D.     JHF/MEDQ  D:  01/25/2011  T:  01/26/2011  Job:  [id number]  Electronically Signed by Reynaldo Minium M.D. on 01/29/2011 12:27:22 PM

## 2011-02-15 ENCOUNTER — Ambulatory Visit (INDEPENDENT_AMBULATORY_CARE_PROVIDER_SITE_OTHER): Payer: BC Managed Care – PPO | Admitting: Gynecology

## 2011-02-15 DIAGNOSIS — Z9889 Other specified postprocedural states: Secondary | ICD-10-CM

## 2011-03-15 ENCOUNTER — Ambulatory Visit (INDEPENDENT_AMBULATORY_CARE_PROVIDER_SITE_OTHER): Payer: BC Managed Care – PPO | Admitting: Gynecology

## 2011-03-15 VITALS — BP 140/84

## 2011-03-15 DIAGNOSIS — F411 Generalized anxiety disorder: Secondary | ICD-10-CM

## 2011-03-15 DIAGNOSIS — Z9889 Other specified postprocedural states: Secondary | ICD-10-CM

## 2011-03-15 DIAGNOSIS — F419 Anxiety disorder, unspecified: Secondary | ICD-10-CM

## 2011-03-15 MED ORDER — ALPRAZOLAM 0.25 MG PO TABS: ORAL_TABLET | ORAL | Status: DC

## 2011-03-15 NOTE — Patient Instructions (Signed)
Take xanax one tablet daily if needed for anxiety

## 2011-03-15 NOTE — Progress Notes (Signed)
Patient presented to the office today for her final six-week postop visit she is status post total laparoscopic hysterectomy for dysmenorrhea and menorrhagia pathology report was benign. Patient is doing well is ready to return back to work.  Exam: Abdomen soft nontender no rebound or guarding Pelvic: Bartholin urethra Skene was within normal limits Vagina: Vaginal cuff intact no gross lesions on inspection Bimanual exam: No palpable masses or tenderness Rectal exam: Deferred  Assessment: The patient has been suffering for some anxiety pre-and postoperatively we discussed about stress releasing activities as she can be engaged in and I will prescribe her and at times IV medication such as a Xanax 0.25 mg to take 1 by mouth daily on an as-needed basis. She may resume 4 normal activity her final postop visit was unremarkable she is otherwise scheduled to return back to the office in one year for her annual exam or when necessary

## 2011-05-14 LAB — URINALYSIS, ROUTINE W REFLEX MICROSCOPIC
Glucose, UA: NEGATIVE
Ketones, ur: 15 — AB
Leukocytes, UA: NEGATIVE
Leukocytes, UA: NEGATIVE
Nitrite: NEGATIVE
Protein, ur: NEGATIVE
Specific Gravity, Urine: 1.013
Urobilinogen, UA: 1
pH: 6.5
pH: 6.5

## 2011-05-14 LAB — URINE MICROSCOPIC-ADD ON

## 2011-05-14 LAB — POCT I-STAT, CHEM 8
BUN: 6
Creatinine, Ser: 1.1
Glucose, Bld: 116 — ABNORMAL HIGH
Hemoglobin: 14.6
Potassium: 3.8

## 2011-05-23 ENCOUNTER — Ambulatory Visit (INDEPENDENT_AMBULATORY_CARE_PROVIDER_SITE_OTHER): Payer: BC Managed Care – PPO | Admitting: Family Medicine

## 2011-05-23 ENCOUNTER — Encounter: Payer: Self-pay | Admitting: Family Medicine

## 2011-05-23 ENCOUNTER — Encounter: Payer: Self-pay | Admitting: *Deleted

## 2011-05-23 VITALS — BP 138/96 | HR 78 | Temp 99.1°F | Wt 179.4 lb

## 2011-05-23 DIAGNOSIS — I10 Essential (primary) hypertension: Secondary | ICD-10-CM

## 2011-05-23 LAB — BASIC METABOLIC PANEL
Calcium: 9.1 mg/dL (ref 8.4–10.5)
GFR: 73.68 mL/min (ref >60.00)
Potassium: 4 mEq/L (ref 3.5–5.1)
Sodium: 139 mEq/L (ref 135–145)

## 2011-05-23 MED ORDER — HYDROCHLOROTHIAZIDE 25 MG PO TABS: 25.0000 mg | ORAL_TABLET | Freq: Every day | ORAL | Status: DC

## 2011-05-23 NOTE — Progress Notes (Signed)
  Subjective:    [name] is a 40 y.o. female who presents for evaluation of elevated blood pressures. Age at onset of elevated blood pressure:  40. Cardiac symptoms: headache. Patient denies: chest pain, chest pressure/discomfort, claudication, dyspnea, exertional chest pressure/discomfort, fatigue, irregular heart beat, lower extremity edema, near-syncope, orthopnea, palpitations, paroxysmal nocturnal dyspnea, syncope and tachypnea. Cardiovascular risk factors: none. Use of agents associated with hypertension: none. History of target organ damage: none.  The following portions of the patient's history were reviewed and updated as appropriate: allergies, current medications, past family history, past medical history, past social history, past surgical history and problem list.  Review of Systems Pertinent items are noted in HPI.   Objective:    BP 138/96  Pulse 78  Temp(Src) 99.1 F (37.3 C) (Oral)  Wt 179 lb 6.4 oz (81.375 kg)  SpO2 98% General appearance: alert, cooperative, appears stated age and no distress Neck: no adenopathy, no carotid bruit, no JVD, supple, symmetrical, trachea midline and thyroid not enlarged, symmetric, no tenderness/mass/nodules Lungs: clear to auscultation bilaterally Heart: regular rate and rhythm, S1, S2 normal, no murmur, click, rub or gallop Extremities: extremities normal, atraumatic, no cyanosis or edema  Cardiographics ECG: normal sinus rhythm    Assessment:    Hypertension, stage 1 . Evidence of target organ damage: none.    Plan:    Medication: begin HCTZ 25 mg daily. Dietary sodium restriction. Regular aerobic exercise. Follow up: 2 weeks and as needed.

## 2011-05-23 NOTE — Patient Instructions (Signed)

## 2011-06-11 ENCOUNTER — Ambulatory Visit (INDEPENDENT_AMBULATORY_CARE_PROVIDER_SITE_OTHER): Payer: BC Managed Care – PPO | Admitting: Internal Medicine

## 2011-06-11 ENCOUNTER — Encounter: Payer: Self-pay | Admitting: Internal Medicine

## 2011-06-11 VITALS — BP 136/78 | HR 84 | Temp 98.3°F | Resp 16 | Ht 67.0 in | Wt 179.4 lb

## 2011-06-11 DIAGNOSIS — R519 Headache, unspecified: Secondary | ICD-10-CM | POA: Insufficient documentation

## 2011-06-11 DIAGNOSIS — I10 Essential (primary) hypertension: Secondary | ICD-10-CM

## 2011-06-11 DIAGNOSIS — R51 Headache: Secondary | ICD-10-CM

## 2011-06-11 DIAGNOSIS — F411 Generalized anxiety disorder: Secondary | ICD-10-CM

## 2011-06-11 MED ORDER — METOPROLOL SUCCINATE ER 50 MG PO TB24: 50.0000 mg | ORAL_TABLET | Freq: Every day | ORAL | Status: DC

## 2011-06-11 MED ORDER — CLONAZEPAM 0.5 MG PO TABS: 0.5000 mg | ORAL_TABLET | Freq: Four times a day (QID) | ORAL | Status: DC | PRN

## 2011-06-11 NOTE — Assessment & Plan Note (Addendum)
Long h/o anxiety, exacerbated after hysterectomy . At some point years ago showed poor tolerance to SSRIs (including suicidal ideas); recently Rx xanax per gyn w/o improvement. I recommend HRT but she already d/w gyn and won't do it d/t FH of cancer Also rec to see psych, I like them to re-try SSRIs but pt reluctant to be referred due to cost At this point , will then recommend to change from  xanax to clonazepam  Re asses in 4 weeks

## 2011-06-11 NOTE — Progress Notes (Signed)
  Subjective:    Patient ID: [name], female    DOB: May 14, 1971, 40 y.o.   MRN: [id number]  HPI Here for followup on hypertension, also concerned about her headache.  She started with a headache about 3 weeks ago, is usually on the right side of the head and anteriorly, occasionally radiates to the top of the head, is on and off; before the blood pressure treatment HA was rated as a 9 on a scale of 0-10. She reports it was the worse headache of her life. Described as pulsatile or throbbing.  She was seen recently for elevated BP, Bp was as high as 187/90,  she started HCTZ. Also in June when she was operated on, they  noted to have elevated BP per patient. HA have decreased since she started the diuretic but "they are still there"; at the present time, HA rated 3 on a scale 0-10. She is tolerating HCTZ well, ambulatory BPs 130/75 most of the time, occ readings are higher   Past Medical History  Diagnosis Date  . Kidney failure 1999    rt kidney functions @ 40%, sees urology routinely (Dr.Peterson), kidney stones, recurrent  . Abortion history     x1  . Miscarriage     x1   . Scarlet fever     x3  . Abnormal vaginal Pap smear   . Anxiety   . BRCA1 negative 11/12/10    NEGATIVE MUTATION  . BRCA2 negative 11/12/10    NEGATIVE MUTATION  . Herpes simplex     HSV  . Nephrolithiasis   . Chronic cystitis   . Dysmenorrhea   . Menorrhagia    Past Surgical History  Procedure Date  . Tonsillectomy   . Tubal ligation   . Cesarean section     X 2 W BTSP  . Endometrial ablation   . Laparoscopic total hysterectomy 6.21.2012    [facility]     Review of Systems Doing better with diet (low salt) No recent nausea, no nuchal rigidity. When asked, admits to a lot of stress in her life. She has a long history of anxiety, symptoms worse since her hysterectomy 01/2011. She  spoke to her gynecologist about it, has declined HRT, was rx xanax which is not helping much. No suicidal.       Objective:   Physical Exam  Constitutional: She is oriented to person, place, and time. She appears well-developed and well-nourished.  HENT:  Head: Normocephalic and atraumatic.  Eyes: EOM are normal. Pupils are equal, round, and reactive to light.  Cardiovascular: Normal rate, regular rhythm and normal heart sounds.   No murmur heard. Pulmonary/Chest: Effort normal and breath sounds normal. No respiratory distress. She has no wheezes. She has no rales.  Musculoskeletal: She exhibits no edema.  Neurological: She is alert and oriented to person, place, and time. She has normal reflexes. No cranial nerve deficit. She exhibits normal muscle tone. Coordination normal.  Psychiatric:       Emotional at times during the interview          Assessment & Plan:

## 2011-06-11 NOTE — Assessment & Plan Note (Addendum)
See HPI, mild HTN, tolerates HCTZ well, ambulatory blood pressures occasionally higher than 130 (170 sometimes per pt) Given HAs,  will change to BBs

## 2011-06-11 NOTE — Patient Instructions (Signed)
Talk with Luster Landsberg about your CT

## 2011-06-11 NOTE — Assessment & Plan Note (Signed)
Had the worse HA of her life recently, slt better after BP treatment but not completely well Will do a CT, if neg, will reasses after treatment for anxiety

## 2011-06-17 ENCOUNTER — Ambulatory Visit (INDEPENDENT_AMBULATORY_CARE_PROVIDER_SITE_OTHER)
Admission: RE | Admit: 2011-06-17 | Discharge: 2011-06-17 | Disposition: A | Discharge status: Home / Self Care | Payer: BC Managed Care – PPO | Source: Ambulatory Visit | Attending: Internal Medicine | Admitting: Internal Medicine

## 2011-06-17 DIAGNOSIS — R51 Headache: Secondary | ICD-10-CM

## 2011-06-19 ENCOUNTER — Telehealth: Payer: Self-pay

## 2011-06-19 NOTE — Telephone Encounter (Signed)
Message copied by Francisco Capuchin on Wed Jun 19, 2011  4:44 PM ------      Message from: [name]      Created: Mon Jun 17, 2011  6:15 PM       Advise patient, CT is essentially negative, patient to call if headache is not improving

## 2011-07-12 ENCOUNTER — Ambulatory Visit (INDEPENDENT_AMBULATORY_CARE_PROVIDER_SITE_OTHER): Payer: BC Managed Care – PPO | Admitting: Internal Medicine

## 2011-07-12 ENCOUNTER — Encounter: Payer: Self-pay | Admitting: Internal Medicine

## 2011-07-12 DIAGNOSIS — Z23 Encounter for immunization: Secondary | ICD-10-CM

## 2011-07-12 DIAGNOSIS — F411 Generalized anxiety disorder: Secondary | ICD-10-CM

## 2011-07-12 DIAGNOSIS — I10 Essential (primary) hypertension: Secondary | ICD-10-CM

## 2011-07-12 DIAGNOSIS — R51 Headache: Secondary | ICD-10-CM

## 2011-07-12 MED ORDER — CLONAZEPAM 0.5 MG PO TABS: 0.5000 mg | ORAL_TABLET | Freq: Four times a day (QID) | ORAL | Status: DC | PRN

## 2011-07-12 NOTE — Assessment & Plan Note (Signed)
HAs resolved, particularly after she started taking aspirin. CT 10-12 essentially negative. See report

## 2011-07-12 NOTE — Progress Notes (Signed)
  Subjective:    Patient ID: [name], female    DOB: [birth year], 40 y.o.   MRN: [id number]  HPI Followup from previous visit, doing very well. Medication list reviewed, good compliance, no side effects  Past Medical History  Diagnosis Date  . Kidney failure 1999    rt kidney functions @ 40%, sees urology routinely (Dr.Peterson), kidney stones, recurrent  . Abortion history     x1  . Miscarriage     x1   . Scarlet fever     x3  . Abnormal vaginal Pap smear   . Anxiety   . BRCA1 negative 11/12/10    NEGATIVE MUTATION  . BRCA2 negative 11/12/10    NEGATIVE MUTATION  . Herpes simplex     HSV  . Nephrolithiasis   . Chronic cystitis   . Dysmenorrhea   . Menorrhagia      Review of Systems Anxiety under much better control with clonazepam, takes an average of 3 tablets a day, denies depression, no oversedation. Sleeping well. Ambulatory blood pressures within normal. Headaches eventually resolved couple of weeks ago, at that time she started taking aspirin and wonders if aspirin has to do with it.    Objective:   Physical Exam  Constitutional: She is oriented to person, place, and time. She appears well-developed and well-nourished.  Cardiovascular: Normal heart sounds.   No murmur heard. Pulmonary/Chest: Effort normal and breath sounds normal. No respiratory distress. She has no wheezes. She has no rales.  Neurological: She is alert and oriented to person, place, and time.  Psychiatric: She has a normal mood and affect. Her behavior is normal. Judgment and thought content normal.      Assessment & Plan:

## 2011-07-12 NOTE — Assessment & Plan Note (Signed)
Under excellent control 

## 2011-07-12 NOTE — Assessment & Plan Note (Signed)
Under excellent control with clonazepam, refill meds

## 2011-10-17 ENCOUNTER — Other Ambulatory Visit: Payer: Self-pay

## 2011-10-17 MED ORDER — CLONAZEPAM 0.5 MG PO TABS: 0.5000 mg | ORAL_TABLET | Freq: Four times a day (QID) | ORAL | Status: DC | PRN

## 2011-10-17 NOTE — Telephone Encounter (Signed)
Patient is requesting a refill on her Klonopin or do you want to see her again?.  She doesn't have any refills left on the prescription you gave her in November.

## 2011-10-17 NOTE — Telephone Encounter (Signed)
Ok 90, 2 RF 

## 2011-10-17 NOTE — Telephone Encounter (Signed)
Refill done.  

## 2011-10-30 ENCOUNTER — Ambulatory Visit (INDEPENDENT_AMBULATORY_CARE_PROVIDER_SITE_OTHER): Payer: BC Managed Care – PPO | Admitting: Internal Medicine

## 2011-10-30 VITALS — BP 120/80 | HR 71 | Temp 97.9°F | Wt 189.0 lb

## 2011-10-30 DIAGNOSIS — N390 Urinary tract infection, site not specified: Secondary | ICD-10-CM

## 2011-10-30 DIAGNOSIS — I10 Essential (primary) hypertension: Secondary | ICD-10-CM

## 2011-10-30 DIAGNOSIS — F411 Generalized anxiety disorder: Secondary | ICD-10-CM

## 2011-10-30 DIAGNOSIS — A6000 Herpesviral infection of urogenital system, unspecified: Secondary | ICD-10-CM

## 2011-10-30 LAB — POCT URINALYSIS DIPSTICK
Bilirubin, UA: NEGATIVE
Glucose, UA: NEGATIVE
Nitrite, UA: NEGATIVE
Spec Grav, UA: 1.01
Urobilinogen, UA: 0.2

## 2011-10-30 MED ORDER — SULFAMETHOXAZOLE-TRIMETHOPRIM 800-160 MG PO TABS: 1.0000 | ORAL_TABLET | Freq: Two times a day (BID) | ORAL | Status: AC

## 2011-10-30 MED ORDER — VALACYCLOVIR HCL 500 MG PO TABS: 500.0000 mg | ORAL_TABLET | Freq: Every day | ORAL | Status: DC

## 2011-10-30 MED ORDER — FLUCONAZOLE 150 MG PO TABS: 150.0000 mg | ORAL_TABLET | Freq: Once | ORAL | Status: AC

## 2011-10-30 NOTE — Progress Notes (Signed)
  Subjective:    Patient ID: [name], female    DOB: [birth year], 41 y.o.   MRN: [id number]  HPI Several issues: Developed urinary pressure and urinary frequency today, "I know I'm getting a UTI". Also complains of anxiety not well controlled. She tried to decrease the dose of clonazepam, that didn't work, she got very anxious and angry at times. Also, she has a history of herpes, she's having  at least one recurrence a month, on Valtrex when necessary.  Past Medical History  Diagnosis Date  . Kidney failure 1999    rt kidney functions @ 40%, sees urology routinely (Dr.Peterson), kidney stones, recurrent  . Abortion history     x1  . Miscarriage     x1   . Scarlet fever     x3  . Abnormal vaginal Pap smear   . Anxiety   . BRCA1 negative 11/12/10    NEGATIVE MUTATION  . BRCA2 negative 11/12/10    NEGATIVE MUTATION  . Herpes simplex     HSV  . Nephrolithiasis   . Chronic cystitis   . Dysmenorrhea   . Menorrhagia    Past Surgical History  Procedure Date  . Tonsillectomy   . Tubal ligation   . Cesarean section     X 2 W BTSP  . Endometrial ablation   . Laparoscopic total hysterectomy 6.21.2012    [facility]    Social history Works at [employer], Restaurant manager, fast food.  Review of Systems No fever or chills No nausea, vomiting, diarrhea No gross hematuria As far as her mood, she had admits that  from time to time there is days she feels exceedingly well and  days that she feels poorly, anxious, angry. Denies any suicidal ideas or homicidal thoughts.     Objective:   Physical Exam Alert, oriented x3. Back: No CVA tenderness Abdomen nondistended, soft, good bowel sounds. Extremities no edema Psychological, emotional and tearful at times but coherent and cooperative       Assessment & Plan:  Today , I spent more than 25 min with the patient, >50% of the time counseling

## 2011-10-30 NOTE — Assessment & Plan Note (Signed)
Symptoms consistent with early UTI, start Bactrim

## 2011-10-30 NOTE — Patient Instructions (Signed)
Drink plenty of fluids and take the antibiotic for 3 days. Call me if you're not better in few  days.

## 2011-10-30 NOTE — Assessment & Plan Note (Signed)
According to the patient, she has at least one exacerbation a month. Will change Valtrex from when necessary to one tablet daily

## 2011-10-30 NOTE — Assessment & Plan Note (Signed)
Reports good compliance and normal ambulatory BP

## 2011-10-30 NOTE — Assessment & Plan Note (Addendum)
Continue with anxiety, see history of present illness. She has days when she feels anxious-angry and Klonopin helps very well. On further questioning, she has days that she feels exceptionally well. Bipolar? In the past she has been reluctant to see a counselor or psychiatry, at this point I think is necessary for her to see a psychiatrist to clarify the diagnosis---> bipolar ? Anxiety/depression? We'll arrange a referral noting that in the past she couldn't tolerate SSRIs due to suicidal ideas.

## 2011-10-31 ENCOUNTER — Encounter: Payer: Self-pay | Admitting: Internal Medicine

## 2011-11-01 LAB — URINE CULTURE: Colony Count: 15000

## 2011-11-19 ENCOUNTER — Telehealth: Payer: Self-pay | Admitting: Internal Medicine

## 2011-11-19 NOTE — Telephone Encounter (Signed)
Please provide the patient with the phone number of the counselor-therapiests available in [place]. Also ,please find out if she was dx w/ bipolar d/o

## 2011-11-19 NOTE — Telephone Encounter (Signed)
Patient states that she went to see psychiatrist last week and did not like that at all. She states that she would feel more comfortable seeing a female therapist through behavorial health.

## 2011-11-20 NOTE — Telephone Encounter (Signed)
Discussed with pt. She asked me to mail her a list. No she was not dx w/ bipolor d/o

## 2011-11-20 NOTE — Telephone Encounter (Signed)
List in your desk

## 2012-01-05 ENCOUNTER — Other Ambulatory Visit: Payer: Self-pay | Admitting: Internal Medicine

## 2012-01-09 ENCOUNTER — Encounter: Payer: Self-pay | Admitting: Internal Medicine

## 2012-01-09 ENCOUNTER — Ambulatory Visit (INDEPENDENT_AMBULATORY_CARE_PROVIDER_SITE_OTHER): Payer: BC Managed Care – PPO | Admitting: Internal Medicine

## 2012-01-09 VITALS — BP 118/82 | HR 70 | Temp 98.2°F | Wt 190.0 lb

## 2012-01-09 DIAGNOSIS — A6000 Herpesviral infection of urogenital system, unspecified: Secondary | ICD-10-CM

## 2012-01-09 DIAGNOSIS — F411 Generalized anxiety disorder: Secondary | ICD-10-CM

## 2012-01-09 DIAGNOSIS — I10 Essential (primary) hypertension: Secondary | ICD-10-CM

## 2012-01-09 NOTE — Assessment & Plan Note (Signed)
Symptoms well-controlled with daily Valtrex

## 2012-01-09 NOTE — Patient Instructions (Signed)
Please come back fasting at your convenience for labs FLP ---dx hypertension -------- Next office visit in 6-8 months. Call for refills as needed

## 2012-01-09 NOTE — Assessment & Plan Note (Signed)
Well-controlled, will check FLP, last cholesterol was slightly elevated. See instructions

## 2012-01-09 NOTE — Progress Notes (Signed)
  Subjective:    Patient ID: [name], female    DOB: 1971-02-03, 41 y.o.   MRN: [id number]  HPI Routine office visit Anxiety, she was evaluated by Dr. Tomasa Rand, she reports a  "bad experience", according to the patient she was diagnosed with bipolar after a questionary , there was no conversation or advise. Eventually the patient went to see a counselor and is doing great. Has gained coping skill mechanisms, she feels well about herself. Her own conclusion is that her emotions were upset due to hormonal imbalances after her hysterectomy.  Past Medical History  Diagnosis Date  . Kidney failure 1999    rt kidney functions @ 40%, sees urology routinely (Dr.Peterson), kidney stones, recurrent  . Abortion history     x1  . Miscarriage     x1   . Scarlet fever     x3  . Abnormal vaginal Pap smear   . Anxiety   . BRCA1 negative 11/12/10    NEGATIVE MUTATION  . BRCA2 negative 11/12/10    NEGATIVE MUTATION  . Herpes simplex     HSV  . Nephrolithiasis   . Chronic cystitis   . Dysmenorrhea   . Menorrhagia      Review of Systems Currently taking Valtrex qd with good results, not for her herpes outbreak. At the last visit she was seen with UTI symptoms, symptoms resolved.     Objective:   Physical Exam  Alert, oriented x3, no apparent distress, she seems to be in good spirits      Assessment & Plan:  Today , I spent more than 18  min with the patient, >50% of the time counseling

## 2012-01-09 NOTE — Assessment & Plan Note (Addendum)
Doing great, saw psychiatry, diagnosed with "bipolar", patient felt she was misdiagnosed. Currently sees Mrs. Troxler a Veterinary surgeon and feeling great. Takes Klonopin 1.5 tablet in the morning, thinking about dropping it to one tablet.

## 2012-03-04 ENCOUNTER — Other Ambulatory Visit: Payer: Self-pay | Admitting: Internal Medicine

## 2012-03-04 NOTE — Telephone Encounter (Signed)
Refill done.  

## 2012-03-04 NOTE — Telephone Encounter (Signed)
Ok to refill 

## 2012-04-05 ENCOUNTER — Other Ambulatory Visit: Payer: Self-pay | Admitting: Internal Medicine

## 2012-04-06 NOTE — Telephone Encounter (Signed)
Refill done.  

## 2012-10-05 ENCOUNTER — Other Ambulatory Visit: Payer: Self-pay | Admitting: Internal Medicine

## 2012-10-05 NOTE — Telephone Encounter (Signed)
Refill done.  

## 2012-12-04 ENCOUNTER — Encounter: Payer: Self-pay | Admitting: *Deleted

## 2012-12-04 ENCOUNTER — Other Ambulatory Visit: Payer: Self-pay | Admitting: Internal Medicine

## 2012-12-04 NOTE — Telephone Encounter (Signed)
Refill done.  Letter mailed to pt to make aware she is due for an OV.  

## 2013-01-05 ENCOUNTER — Other Ambulatory Visit: Payer: Self-pay | Admitting: Internal Medicine

## 2013-01-05 NOTE — Telephone Encounter (Signed)
Refill done.  

## 2013-02-04 ENCOUNTER — Other Ambulatory Visit: Payer: Self-pay | Admitting: Internal Medicine

## 2013-02-04 NOTE — Telephone Encounter (Signed)
Left detailed msg making pt aware we refilled his metoprolol for 1 more month but no more refills w/o an office visit.  Refill done.

## 2013-02-23 ENCOUNTER — Ambulatory Visit (INDEPENDENT_AMBULATORY_CARE_PROVIDER_SITE_OTHER): Payer: BC Managed Care – PPO | Admitting: Internal Medicine

## 2013-02-23 ENCOUNTER — Encounter: Payer: Self-pay | Admitting: Internal Medicine

## 2013-02-23 VITALS — BP 140/82 | HR 68 | Temp 98.2°F | Wt 203.0 lb

## 2013-02-23 DIAGNOSIS — R609 Edema, unspecified: Secondary | ICD-10-CM

## 2013-02-23 DIAGNOSIS — I10 Essential (primary) hypertension: Secondary | ICD-10-CM

## 2013-02-23 DIAGNOSIS — A6000 Herpesviral infection of urogenital system, unspecified: Secondary | ICD-10-CM

## 2013-02-23 DIAGNOSIS — F411 Generalized anxiety disorder: Secondary | ICD-10-CM

## 2013-02-23 MED ORDER — VALACYCLOVIR HCL 500 MG PO TABS: 500.0000 mg | ORAL_TABLET | Freq: Every day | ORAL | Status: DC

## 2013-02-23 MED ORDER — METOPROLOL SUCCINATE ER 50 MG PO TB24: 50.0000 mg | ORAL_TABLET | Freq: Every day | ORAL | Status: DC

## 2013-02-23 NOTE — Assessment & Plan Note (Signed)
Doing great, not on benzos in a while

## 2013-02-23 NOTE — Assessment & Plan Note (Addendum)
New problem Edema as described , see history of present illness. No evidence of vol overload, related to salt intake? Related to her cycles? Plan: Check a sedimentation rate to r/o major joint inflammation . Otherwise observation

## 2013-02-23 NOTE — Assessment & Plan Note (Signed)
Hardly ever has symptoms, usually take Valtrex twice a day for 3 days with onset of rash

## 2013-02-23 NOTE — Patient Instructions (Addendum)
Check the  blood pressure 2 or 3 times a week, be sure it is between 110/60 and 140/85. If it is consistently higher or lower, let me know Call if the swelling gets worse or if you dveloped joint aches, redness or warmness. As long as your BP is okay we don't need to see you in 6-8 months.

## 2013-02-23 NOTE — Progress Notes (Signed)
  Subjective:    Patient ID: [name], female    DOB: [birth year], 42 y.o.   MRN: [id number]  HPI Routine office visit Hypertension, good medication compliance. Anxiety, has not been taking clonazepam in a while, does not need it anymore despite having some stress in her life (lost Job). Doing well. Liked to discuss  occasional left ankle and hands swelling, sx are on and off, one to 2 times a week, swelling is worse on the left hand, no swelling in the right ankle. The joints are not red or warm. Not  sure if related to her cycles.  Past Medical History  Diagnosis Date  . Kidney failure 1999    rt kidney functions @ 40%, sees urology routinely (Dr.Peterson), kidney stones, recurrent  . Abortion history     x1  . Miscarriage     x1   . Abnormal vaginal Pap smear   . Anxiety   . BRCA1 negative 11/12/10    NEGATIVE MUTATION  . BRCA2 negative 11/12/10    NEGATIVE MUTATION  . Herpes simplex     HSV  . Nephrolithiasis   . Chronic cystitis    Past Surgical History  Procedure Laterality Date  . Tonsillectomy    . Tubal ligation    . Cesarean section      X 2 W BTSP  . Endometrial ablation    . Laparoscopic total hysterectomy  6.21.2012    [facility]   History   Social History  . Marital Status: Married    Spouse Name: N/A    Number of Children: 2  . Years of Education: N/A   Occupational History  . unemployed  [employer]   Social History Main Topics  . Smoking status: Current Every Day Smoker -- 0.50 packs/day  . Smokeless tobacco: Never Used  . Alcohol Use: Yes     Comment: rarely  . Drug Use: No     Comment: no recent marijuana  . Sexually Active: Not on file   Other Topics Concern  . Not on file   Social History Narrative   Mom is [name] one of my patients   Lost father Dorene Sorrow 3/11           Review of Systems Does like to eat low salt. No chest pain, sob or pitting pretibial edema. No actual joint pains. No headaches or unexplained weight loss. No  fever, chills or a rash.    Objective:   Physical Exam  General -- alert, well-developed, Nad   Neck --no JVD at 45  Lungs -- normal respiratory effort, no intercostal retractions, no accessory muscle use, and normal breath sounds.   Heart-- normal rate, regular rhythm, no murmur, and no gallop.   Extremities-- no pretibial edema bilaterally; Ankles, hands and wrists without synovitis on exam  Neurologic-- alert & oriented X3 and strength normal in all extremities. Psych-- Cognition and judgment appear intact. Alert and cooperative with normal attention span and concentration.  not anxious appearing and not depressed appearing.       Assessment & Plan:  Female care, sees gynecology

## 2013-02-23 NOTE — Assessment & Plan Note (Addendum)
Good med compliance Check a BMP Monitor BPs, see instructions RF meds

## 2013-09-27 ENCOUNTER — Ambulatory Visit (INDEPENDENT_AMBULATORY_CARE_PROVIDER_SITE_OTHER): Payer: BC Managed Care – PPO | Admitting: Internal Medicine

## 2013-09-27 ENCOUNTER — Encounter: Payer: Self-pay | Admitting: Internal Medicine

## 2013-09-27 VITALS — BP 123/77 | HR 67 | Temp 98.6°F | Wt 204.0 lb

## 2013-09-27 DIAGNOSIS — R05 Cough: Secondary | ICD-10-CM

## 2013-09-27 DIAGNOSIS — R059 Cough, unspecified: Secondary | ICD-10-CM

## 2013-09-27 MED ORDER — AZITHROMYCIN 250 MG PO TABS: ORAL_TABLET | ORAL | Status: DC

## 2013-09-27 MED ORDER — FLUCONAZOLE 150 MG PO TABS: 150.0000 mg | ORAL_TABLET | Freq: Once | ORAL | Status: DC

## 2013-09-27 MED ORDER — BENZONATATE 200 MG PO CAPS: 200.0000 mg | ORAL_CAPSULE | Freq: Two times a day (BID) | ORAL | Status: DC | PRN

## 2013-09-27 NOTE — Patient Instructions (Signed)
Rest, fluids , tylenol For cough, take Mucinex DM twice a day as needed  If the cough continue use tessalon Perles  If  congestion use OTC Nasocort: 2 nasal sprays on each side of the nose daily until you feel better  Take the antibiotic as prescribed  (zpack) Call if no better in few days Call anytime if the symptoms are severe  Please schedule a physical at your convenience

## 2013-09-27 NOTE — Progress Notes (Signed)
   Subjective:    Patient ID: [name], female    DOB: 1971/05/20, 43 y.o.   MRN: [id number]  DOS:  09/27/2013 Acute visit,  Had a flu like illness a month ago, cold symptoms are better except for a deep, "chesty" cough. OTCs like Mucinex help very little. Other than that she feels great. Very seldom produces clear sputum, occasional sputum is brown, no hemoptysis. Occasionally difficulty sleeping due to cough She quit tobacco about 6 weeks ago.    Past Medical History  Diagnosis Date  . Kidney failure 1999    rt kidney functions @ 40%, sees urology routinely (Dr.Peterson), kidney stones, recurrent  . Abortion history     x1  . Miscarriage     x1   . Abnormal vaginal Pap smear   . Anxiety   . BRCA1 negative 11/12/10    NEGATIVE MUTATION  . BRCA2 negative 11/12/10    NEGATIVE MUTATION  . Herpes simplex     HSV  . Nephrolithiasis   . Chronic cystitis     Past Surgical History  Procedure Laterality Date  . Tonsillectomy    . Tubal ligation    . Cesarean section      X 2 W BTSP  . Endometrial ablation    . Laparoscopic total hysterectomy  6.21.2012    [facility]    History   Social History  . Marital Status: Married    Spouse Name: N/A    Number of Children: 2  . Years of Education: N/A   Occupational History  . unemployed  [employer]   Social History Main Topics  . Smoking status: Former Smoker -- 0.50 packs/day  . Smokeless tobacco: Never Used     Comment: quit 07-2013  . Alcohol Use: Yes     Comment: rarely  . Drug Use: No     Comment: no recent marijuana  . Sexual Activity: Not on file   Other Topics Concern  . Not on file   Social History Narrative   Lost mom 2014 Virda Betters one of my patients)   Lost father Sonia Side 3/11             ROS  No fever or chills since the flu illness No chest pain or difficulty breathing Had some wheezing with acute illness but not now. Also requests a prescription for Diflucan as she has a tendency to develop yeast  infections with antibiotics    Objective:   Physical Exam BP 123/77  Pulse 67  Temp(Src) 98.6 F (37 C)  Wt 204 lb (92.534 kg)  SpO2 97% General -- alert, well-developed, NAD.  HEENT-- Not pale. TMs normal, throat symmetric, no redness or discharge. Face symmetric, sinuses not tender to palpation. Nose not congested. Lungs -- normal respiratory effort, no intercostal retractions, no accessory muscle use, and normal breath sounds.  Heart-- normal rate, regular rhythm, no murmur.  Extremities-- no pretibial edema bilaterally  Neurologic--  alert & oriented X3. Speech normal, gait normal, strength normal in all extremities.  Psych-- Cognition and judgment appear intact. Cooperative with normal attention span and concentration. No anxious or depressed appearing.      Assessment & Plan:   Cough, Persisting cough after a flulike illness, atypical bronchitis?. Plan: Z-Pak, diflucan per patient request, see instructions.

## 2013-09-27 NOTE — Progress Notes (Signed)
Pre visit review using our clinic review tool, if applicable. No additional management support is needed unless otherwise documented below in the visit note. 

## 2013-09-28 ENCOUNTER — Encounter: Payer: Self-pay | Admitting: Internal Medicine

## 2013-12-03 ENCOUNTER — Ambulatory Visit (INDEPENDENT_AMBULATORY_CARE_PROVIDER_SITE_OTHER): Payer: BC Managed Care – PPO | Admitting: Gynecology

## 2013-12-03 ENCOUNTER — Encounter: Payer: Self-pay | Admitting: Gynecology

## 2013-12-03 VITALS — BP 138/90 | Ht 66.5 in | Wt 202.0 lb

## 2013-12-03 DIAGNOSIS — F17200 Nicotine dependence, unspecified, uncomplicated: Secondary | ICD-10-CM

## 2013-12-03 DIAGNOSIS — Z01419 Encounter for gynecological examination (general) (routine) without abnormal findings: Secondary | ICD-10-CM

## 2013-12-03 DIAGNOSIS — Z803 Family history of malignant neoplasm of breast: Secondary | ICD-10-CM | POA: Insufficient documentation

## 2013-12-03 DIAGNOSIS — Z87891 Personal history of nicotine dependence: Secondary | ICD-10-CM | POA: Insufficient documentation

## 2013-12-03 MED ORDER — NICOTINE 14 MG/24HR TD PT24
14.0000 mg | MEDICATED_PATCH | Freq: Every day | TRANSDERMAL | Status: DC
Start: 2013-12-03 — End: 2014-12-21

## 2013-12-03 NOTE — Patient Instructions (Addendum)
Smoking Cessation Quitting smoking is important to your health and has many advantages. However, it is not always easy to quit since nicotine is a very addictive drug. Often times, people try 3 times or more before being able to quit. This document explains the best ways for you to prepare to quit smoking. Quitting takes hard work and a lot of effort, but you can do it. ADVANTAGES OF QUITTING SMOKING  You will live longer, feel better, and live better.  Your body will feel the impact of quitting smoking almost immediately.  Within 20 minutes, blood pressure decreases. Your pulse returns to its normal level.  After 8 hours, carbon monoxide levels in the blood return to normal. Your oxygen level increases.  After 24 hours, the chance of having a heart attack starts to decrease. Your breath, hair, and body stop smelling like smoke.  After 48 hours, damaged nerve endings begin to recover. Your sense of taste and smell improve.  After 72 hours, the body is virtually free of nicotine. Your bronchial tubes relax and breathing becomes easier.  After 2 to 12 weeks, lungs can hold more air. Exercise becomes easier and circulation improves.  The risk of having a heart attack, stroke, cancer, or lung disease is greatly reduced.  After 1 year, the risk of coronary heart disease is cut in half.  After 5 years, the risk of stroke falls to the same as a nonsmoker.  After 10 years, the risk of lung cancer is cut in half and the risk of other cancers decreases significantly.  After 15 years, the risk of coronary heart disease drops, usually to the level of a nonsmoker.  If you are pregnant, quitting smoking will improve your chances of having a healthy baby.  The people you live with, especially any children, will be healthier.  You will have extra money to spend on things other than cigarettes. QUESTIONS TO THINK ABOUT BEFORE ATTEMPTING TO QUIT You may want to talk about your answers with your  caregiver.  Why do you want to quit?  If you tried to quit in the past, what helped and what did not?  What will be the most difficult situations for you after you quit? How will you plan to handle them?  Who can help you through the tough times? Your family? Friends? A caregiver?  What pleasures do you get from smoking? What ways can you still get pleasure if you quit? Here are some questions to ask your caregiver:  How can you help me to be successful at quitting?  What medicine do you think would be best for me and how should I take it?  What should I do if I need more help?  What is smoking withdrawal like? How can I get information on withdrawal? GET READY  Set a quit date.  Change your environment by getting rid of all cigarettes, ashtrays, matches, and lighters in your home, car, or work. Do not let people smoke in your home.  Review your past attempts to quit. Think about what worked and what did not. GET SUPPORT AND ENCOURAGEMENT You have a better chance of being successful if you have help. You can get support in many ways.  Tell your family, friends, and co-workers that you are going to quit and need their support. Ask them not to smoke around you.  Get individual, group, or telephone counseling and support. Programs are available at local hospitals and health centers. Call your local health department for   information about programs in your area.  Spiritual beliefs and practices may help some smokers quit.  Download a "quit meter" on your computer to keep track of quit statistics, such as how long you have gone without smoking, cigarettes not smoked, and money saved.  Get a self-help book about quitting smoking and staying off of tobacco. LEARN NEW SKILLS AND BEHAVIORS  Distract yourself from urges to smoke. Talk to someone, go for a walk, or occupy your time with a task.  Change your normal routine. Take a different route to work. Drink tea instead of coffee.  Eat breakfast in a different place.  Reduce your stress. Take a hot bath, exercise, or read a book.  Plan something enjoyable to do every day. Reward yourself for not smoking.  Explore interactive web-based programs that specialize in helping you quit. GET MEDICINE AND USE IT CORRECTLY Medicines can help you stop smoking and decrease the urge to smoke. Combining medicine with the above behavioral methods and support can greatly increase your chances of successfully quitting smoking.  Nicotine replacement therapy helps deliver nicotine to your body without the negative effects and risks of smoking. Nicotine replacement therapy includes nicotine gum, lozenges, inhalers, nasal sprays, and skin patches. Some may be available over-the-counter and others require a prescription.  Antidepressant medicine helps people abstain from smoking, but how this works is unknown. This medicine is available by prescription.  Nicotinic receptor partial agonist medicine simulates the effect of nicotine in your brain. This medicine is available by prescription. Ask your caregiver for advice about which medicines to use and how to use them based on your health history. Your caregiver will tell you what side effects to look out for if you choose to be on a medicine or therapy. Carefully read the information on the package. Do not use any other product containing nicotine while using a nicotine replacement product.  RELAPSE OR DIFFICULT SITUATIONS Most relapses occur within the first 3 months after quitting. Do not be discouraged if you start smoking again. Remember, most people try several times before finally quitting. You may have symptoms of withdrawal because your body is used to nicotine. You may crave cigarettes, be irritable, feel very hungry, cough often, get headaches, or have difficulty concentrating. The withdrawal symptoms are only temporary. They are strongest when you first quit, but they will go away within  10 14 days. To reduce the chances of relapse, try to:  Avoid drinking alcohol. Drinking lowers your chances of successfully quitting.  Reduce the amount of caffeine you consume. Once you quit smoking, the amount of caffeine in your body increases and can give you symptoms, such as a rapid heartbeat, sweating, and anxiety.  Avoid smokers because they can make you want to smoke.  Do not let weight gain distract you. Many smokers will gain weight when they quit, usually less than 10 pounds. Eat a healthy diet and stay active. You can always lose the weight gained after you quit.  Find ways to improve your mood other than smoking. FOR MORE INFORMATION  www.smokefree.gov  Document Released: 07/23/2001 Document Revised: 01/28/2012 Document Reviewed: 11/07/2011 [employer] Patient Information 2014 [employer]. Nicotine skin patches What is this medicine? NICOTINE (NIK [place] teen) helps people stop smoking. The patches replace the nicotine found in cigarettes and help to decrease withdrawal effects. They are most effective when used in combination with a stop-smoking program. This medicine may be used for other purposes; ask your health care provider or pharmacist if you   have questions. COMMON BRAND NAME(S): Habitrol, Nicoderm CQ, Nicotrol What should I tell my health care provider before I take this medicine? They need to know if you have any of these conditions: -diabetes -heart disease, angina, irregular heartbeat or previous heart attack -lung disease, including asthma -overactive thyroid -pheochromocytoma -skin problems -stomach problems or ulcers -an unusual or allergic reaction to nicotine, adhesives, other medicines, foods, dyes, or preservatives -pregnant or trying to get pregnant -breast-feeding How should I use this medicine? This medicine is for use on the skin. Follow the directions that come with the patches. Find an area of skin on your upper arm, chest, or back that is clean,  dry, greaseless, undamaged and hairless. Wash hands with plain soap and water. Do not use anything that contains aloe, lanolin or glycerin as these may prevent the patch from sticking. Dry thoroughly. Remove the patch from the sealed pouch. Do not try to cut or trim the patch. Using your palm, press the patch firmly in place for 10 seconds to make sure that there is good contact with your skin. After applying the patch, wash your hands. Change the patch every day, keeping to a regular schedule. When you apply a new patch, use a new area of skin. Wait at least 1 week before using the same area again. Talk to your pediatrician regarding the use of this medicine in children. Special care may be needed. Overdosage: If you think you have taken too much of this medicine contact a poison control center or emergency room at once. NOTE: This medicine is only for you. Do not share this medicine with others. What if I miss a dose? If you forget to replace a patch, use it as soon as you can. Only use one patch at a time and do not leave on the skin for longer than directed. If a patch falls off, you can replace it, but keep to your schedule and remove the patch at the right time. What may interact with this medicine? -medicines for asthma -medicines for blood pressure -medicines for mental depression This list may not describe all possible interactions. Give your health care provider a list of all the medicines, herbs, non-prescription drugs, or dietary supplements you use. Also tell them if you smoke, drink alcohol, or use illegal drugs. Some items may interact with your medicine. What should I watch for while using this medicine? Do not smoke, chew nicotine gum, or use snuff while you are using this medicine. This reduces the chance of a nicotine overdose. You can keep the patch in place during swimming, bathing, and showering. If your patch falls off during these activities, replace it. When you first apply the  patch, your skin may itch or burn. This should soon go away. When you remove a patch, the skin may look red, but this should only last for a day. Call your doctor or health care professional if you get a permanent skin rash. If you are a diabetic and you quit smoking, the effects of insulin may be increased and you may need to reduce your insulin dose. Check with your doctor or health care professional about how you should adjust your insulin dose. If you are going to have a magnetic resonance imaging (MRI) procedure, tell your MRI technician if you have this patch on your body. It must be removed before a MRI. What side effects may I notice from receiving this medicine? Side effects that you should report to your doctor or health care   professional as soon as possible: -allergic reactions like skin rash, itching or hives, swelling of the face, lips, or tongue -breathing problems -changes in hearing -changes in vision -chest pain -cold sweats -confusion -fast, irregular heartbeat -feeling faint or lightheaded, falls -headache -increased saliva -nausea, vomiting -skin redness that lasts more than 4 days -stomach pain -weakness Side effects that usually do not require medical attention (report to your doctor or health care professional if they continue or are bothersome): -diarrhea -dry mouth -hiccups -irritability -nervousness or restlessness -trouble sleeping or vivid dreams This list may not describe all possible side effects. Call your doctor for medical advice about side effects. You may report side effects to FDA at 1-800-FDA-1088. Where should I keep my medicine? Keep out of the reach of children. Store at room temperature between 20 and 25 degrees C (68 and 77 degrees F). Protect from heat and light. Store in manufacturers packaging until ready to use. Throw away unused medicine after the expiration date. When you remove a patch, fold with sticky sides together; put in an empty  opened pouch and throw away. NOTE: This sheet is a summary. It may not cover all possible information. If you have questions about this medicine, talk to your doctor, pharmacist, or health care provider.  2014, [employer]. (2010-10-02 13:06:00)  

## 2013-12-03 NOTE — Progress Notes (Signed)
[name] 12-05-1970 [id number]   History:    43 y.o.  for annual gyn exam was not been seen in the office since 2012. Review of her record indicated that in 2012 she had a total laparoscopic hysterectomy as a result of pelvic pain and dysfunctional uterine bleeding. Patient done well from her surgery. Patient has been followed by her urologist as a result of history of right renal lithiasis and right renal failure. Patient has had had history of chronic cystitis but no complaints recently. Her PCP is Dr. Larose Kells who was she'll be seen next few weeks for her annual exam and blood work.  Patient's mother was diagnosed with breast cancer in her 57s and recently died of metastatic cancer to her lungs. Patient's grandmother also had breast cancer. Patient herself was created for the BRCA1 and BRCA2 gene mutation was found to be negative. She does for monthly breast exam but her last mammogram was in 2009.  Patient is a chronic smoker who smokes half a pack of cigarettes per day. Patient states she cannot tolerate any type of antidepressant ages. The fascia had been on Xanax for anxiety but she is coping well now. Her husband also smokes as well. Patient also a strong family diabetes rest both parents have had diabetes.  Patient with no prior history of abnormal Pap smear before her hysterectomy. Pathology specimen did not demonstrate any dysplasia on her cervix. Benign pathology.  Past medical history,surgical history, family history and social history were all reviewed and documented in the EPIC chart.  Gynecologic History No LMP recorded. Patient has had a hysterectomy. Contraception: status post hysterectomy Last Pap: 2012. Results were: normal Last mammogram: 2009. Results were: Normal but dense  Obstetric History OB History  Gravida Para Term Preterm AB SAB TAB Ectopic Multiple Living  _0 # Outcome Date GA Lbr Len/2nd Weight Sex Delivery Anes PTL Lv  3 ABT           2 PAR            1 PAR                ROS: A ROS was performed and pertinent positives and negatives are included in the history.  GENERAL: No fevers or chills. HEENT: No change in vision, no earache, sore throat or sinus congestion. NECK: No pain or stiffness. CARDIOVASCULAR: No chest pain or pressure. No palpitations. PULMONARY: No shortness of breath, cough or wheeze. GASTROINTESTINAL: No abdominal pain, nausea, vomiting or diarrhea, melena or bright red blood per rectum. GENITOURINARY: No urinary frequency, urgency, hesitancy or dysuria. MUSCULOSKELETAL: No joint or muscle pain, no back pain, no recent trauma. DERMATOLOGIC: No rash, no itching, no lesions. ENDOCRINE: No polyuria, polydipsia, no heat or cold intolerance. No recent change in weight. HEMATOLOGICAL: No anemia or easy bruising or bleeding. NEUROLOGIC: No headache, seizures, numbness, tingling or weakness. PSYCHIATRIC: No depression, no loss of interest in normal activity or change in sleep pattern.     Exam: chaperone present  BP 138/90  Ht 5' 6.5" (1.689 m)  Wt 202 lb (91.627 kg)  BMI 32.12 kg/m2  Body mass index is 32.12 kg/(m^2).  General appearance : Well developed well nourished female. No acute distress HEENT: Neck supple, trachea midline, no carotid bruits, no thyroidmegaly Lungs: Clear to auscultation, no rhonchi or wheezes, or rib retractions  Heart: Regular rate and rhythm, no murmurs or gallops Breast:Examined in sitting  and supine position were symmetrical in appearance, no palpable masses or tenderness,  no skin retraction, no nipple inversion, no nipple discharge, no skin discoloration, no axillary or supraclavicular lymphadenopathy Abdomen: no palpable masses or tenderness, no rebound or guarding Extremities: no edema or skin discoloration or tenderness  Pelvic:  Bartholin, Urethra, Skene Glands: Within normal limits             Vagina: No gross lesions or discharge  Cervix: Absent  Uterus  Absent  Adnexa   Without masses or tenderness  Anus and perineum  normal   Rectovaginal  normal sphincter tone without palpated masses or tenderness             Hemoccult not indicated     Assessment/Plan:  43 y.o. female for annual exam who was counseled on the detrimental effects of smoking. She is going to be started on the nicotine patch. Smoking cessation handout was provided. PCP will be drawn her blood work and a fasting state next few weeks. She was reminded of the importance of monthly breast exam. Patient was given a requisition to schedule her mammogram and to request a 3-D as a result of her mother's history of breast cancer in patient's last mammogram in 2009 demonstrating dense breast. No Pap smear done today in accordance to the new guidelines.  Note: This dictation was prepared with  Dragon/digital dictation along withSmart phrase technology. Any transcriptional errors that result from this process are unintentional.   Terrance Mass MD, 10:49 AM 12/03/2013

## 2013-12-23 ENCOUNTER — Encounter: Payer: Self-pay | Admitting: Gynecology

## 2014-03-02 ENCOUNTER — Other Ambulatory Visit: Payer: Self-pay | Admitting: Internal Medicine

## 2014-03-17 ENCOUNTER — Other Ambulatory Visit: Payer: Self-pay | Admitting: Gynecology

## 2014-03-17 DIAGNOSIS — Z1231 Encounter for screening mammogram for malignant neoplasm of breast: Secondary | ICD-10-CM

## 2014-04-05 ENCOUNTER — Ambulatory Visit (HOSPITAL_COMMUNITY)
Admission: RE | Admit: 2014-04-05 | Discharge: 2014-04-05 | Disposition: A | Discharge status: Home / Self Care | Payer: BC Managed Care – PPO | Source: Ambulatory Visit | Attending: Gynecology | Admitting: Gynecology

## 2014-04-05 DIAGNOSIS — Z1231 Encounter for screening mammogram for malignant neoplasm of breast: Secondary | ICD-10-CM

## 2014-06-13 ENCOUNTER — Encounter: Payer: Self-pay | Admitting: Gynecology

## 2014-06-30 ENCOUNTER — Emergency Department (HOSPITAL_COMMUNITY)
Admission: EM | Admit: 2014-06-30 | Discharge: 2014-06-30 | Disposition: A | Discharge status: Home / Self Care | Payer: BC Managed Care – PPO | Attending: Emergency Medicine | Admitting: Emergency Medicine

## 2014-06-30 ENCOUNTER — Encounter (HOSPITAL_COMMUNITY): Payer: Self-pay | Admitting: *Deleted

## 2014-06-30 ENCOUNTER — Other Ambulatory Visit: Payer: Self-pay | Admitting: Nurse Practitioner

## 2014-06-30 ENCOUNTER — Emergency Department (HOSPITAL_COMMUNITY): Payer: BC Managed Care – PPO

## 2014-06-30 ENCOUNTER — Telehealth: Payer: Self-pay

## 2014-06-30 DIAGNOSIS — Z87448 Personal history of other diseases of urinary system: Secondary | ICD-10-CM | POA: Insufficient documentation

## 2014-06-30 DIAGNOSIS — R079 Chest pain, unspecified: Secondary | ICD-10-CM

## 2014-06-30 DIAGNOSIS — I200 Unstable angina: Secondary | ICD-10-CM

## 2014-06-30 DIAGNOSIS — M542 Cervicalgia: Secondary | ICD-10-CM | POA: Insufficient documentation

## 2014-06-30 DIAGNOSIS — J0100 Acute maxillary sinusitis, unspecified: Secondary | ICD-10-CM | POA: Diagnosis not present

## 2014-06-30 DIAGNOSIS — Z720 Tobacco use: Secondary | ICD-10-CM | POA: Insufficient documentation

## 2014-06-30 DIAGNOSIS — Z79899 Other long term (current) drug therapy: Secondary | ICD-10-CM | POA: Insufficient documentation

## 2014-06-30 DIAGNOSIS — M25512 Pain in left shoulder: Secondary | ICD-10-CM

## 2014-06-30 DIAGNOSIS — Z7982 Long term (current) use of aspirin: Secondary | ICD-10-CM | POA: Insufficient documentation

## 2014-06-30 DIAGNOSIS — Z8659 Personal history of other mental and behavioral disorders: Secondary | ICD-10-CM | POA: Diagnosis not present

## 2014-06-30 DIAGNOSIS — Z792 Long term (current) use of antibiotics: Secondary | ICD-10-CM | POA: Insufficient documentation

## 2014-06-30 DIAGNOSIS — Z8619 Personal history of other infectious and parasitic diseases: Secondary | ICD-10-CM | POA: Insufficient documentation

## 2014-06-30 DIAGNOSIS — M791 Myalgia, unspecified site: Secondary | ICD-10-CM

## 2014-06-30 DIAGNOSIS — I10 Essential (primary) hypertension: Secondary | ICD-10-CM | POA: Diagnosis not present

## 2014-06-30 HISTORY — DX: Tobacco use: Z72.0

## 2014-06-30 HISTORY — DX: Essential (primary) hypertension: I10

## 2014-06-30 LAB — I-STAT TROPONIN, ED: TROPONIN I, POC: 0 ng/mL (ref 0.00–0.08)

## 2014-06-30 LAB — TROPONIN I: Troponin I: <0.3 ng/mL (ref <0.30)

## 2014-06-30 LAB — CBC
HEMATOCRIT: 42.7 % (ref 36.0–46.0)
HEMOGLOBIN: 14.1 g/dL (ref 12.0–15.0)
MCH: 28.6 pg (ref 26.0–34.0)
MCHC: 33 g/dL (ref 30.0–36.0)
MCV: 86.6 fL (ref 78.0–100.0)
Platelets: 220 10*3/uL (ref 150–400)
RBC: 4.93 MIL/uL (ref 3.87–5.11)
RDW: 13.6 % (ref 11.5–15.5)
WBC: 10.2 10*3/uL (ref 4.0–10.5)

## 2014-06-30 LAB — BASIC METABOLIC PANEL
Anion gap: 15 (ref 5–15)
BUN: 15 mg/dL (ref 6–23)
CHLORIDE: 103 meq/L (ref 96–112)
CO2: 22 meq/L (ref 19–32)
Calcium: 9.7 mg/dL (ref 8.4–10.5)
Creatinine, Ser: 0.72 mg/dL (ref 0.50–1.10)
GFR calc Af Amer: >90 mL/min (ref >90)
GLUCOSE: 95 mg/dL (ref 70–99)
POTASSIUM: 4 meq/L (ref 3.7–5.3)
SODIUM: 140 meq/L (ref 137–147)

## 2014-06-30 LAB — PRO B NATRIURETIC PEPTIDE: Pro B Natriuretic peptide (BNP): 42 pg/mL (ref 0–125)

## 2014-06-30 LAB — D-DIMER, QUANTITATIVE: D-Dimer, Quant: 0.67 ug/mL-FEU — ABNORMAL HIGH (ref 0.00–0.48)

## 2014-06-30 MED ORDER — IOHEXOL 350 MG/ML SOLN
50.0000 mL | Freq: Once | INTRAVENOUS | Status: AC | PRN
  Administered 2014-06-30: 50 mL via INTRAVENOUS

## 2014-06-30 MED ORDER — SODIUM CHLORIDE 0.9 % IV BOLUS (SEPSIS)
1000.0000 mL | Freq: Once | INTRAVENOUS | Status: AC
  Administered 2014-06-30: 1000 mL via INTRAVENOUS

## 2014-06-30 MED ORDER — AMOXICILLIN-POT CLAVULANATE 875-125 MG PO TABS: 1.0000 | ORAL_TABLET | Freq: Two times a day (BID) | ORAL | Status: DC

## 2014-06-30 NOTE — ED Provider Notes (Signed)
CSN: [id number]     Arrival date & time 06/30/14  1034 History   First MD Initiated Contact with Patient 06/30/14 1531     Chief Complaint  Patient presents with  . Neck Pain     (Consider location/radiation/quality/duration/timing/severity/associated sxs/prior Treatment) The history is provided by the patient. No language interpreter was used.  [name] is a 43 y/o F with PMhx of kidney failure, HTN presenting to the ED with left side neck and arm pain that started yesterday at approximately 7:30 PM while the patient was making muffins. Patient reported that while making muffins she had a sudden onset of dizziness, diaphoresis and nausea that lasted approximately 20-30 minutes. Stated that she felt much better after taking ASA 81 mg. Stated that later in the night she felt exhausted and weak. Reported that she went to bed and when she woke up this morning she felt a tightness and discomfort to her left shoulder, left scapula, left side of neck and left side of upper chest. Patient reported that it is a constant discomfort with numbness and tingling radiating down the left arm. Patient reported that she has never experienced anything like this before. Reported that the left arm feels somewhat heavy and stated that she is still able to lift objects. Patient reported that she was concerned about what happened - stated that her family has a long history of cardiac issues with father having first MI when 70 years old with 5 MIs total and mother having breast cancer with 2 MIs and triple bypass surgery. Patient does have history of HTN and takes medication for it. Reported that she called her physician this morning who recommended patient come to the ED immediately to be assessed. Stated that she smokes 1 ppd of cigarettes. Denied difficulty swallowing, chest pain, shortness of breath, difficulty breathing, blurred vision, sudden loss of vision, speech difficulty, vomiting, diarrhea, weakness in legs,  chronic neck pain, travel, leg swelling. Denied being on birth control-patient reported that she has a hysterectomy in 2012. PCP Dr. Larose Kells  Past Medical History  Diagnosis Date  . Kidney failure 1999    rt kidney functions @ 40%, sees urology routinely (Dr.Peterson), kidney stones, recurrent  . Abortion history     x1  . Miscarriage     x1   . Abnormal vaginal Pap smear   . Anxiety   . BRCA1 negative 11/12/10    NEGATIVE MUTATION  . BRCA2 negative 11/12/10    NEGATIVE MUTATION  . Herpes simplex     HSV  . Nephrolithiasis   . Chronic cystitis   . Hypertension    Past Surgical History  Procedure Laterality Date  . Tonsillectomy    . Tubal ligation    . Cesarean section      X 2 W BTSP  . Endometrial ablation    . Laparoscopic total hysterectomy  6.21.2012    [facility]   Family History  Problem Relation Age of Onset  . Diabetes Father   . Hypertension Father   . Heart disease Father   . Diabetes Mother   . Breast cancer Mother   . Hypertension Mother   . Cancer Maternal Grandfather   . Colon cancer Neg Hx   . Uterine cancer Neg Hx   . Ovarian cancer Neg Hx   . Breast cancer Maternal Grandmother    History  Substance Use Topics  . Smoking status: Current Every Day Smoker -- 1.00 packs/day    Types: Cigarettes  . Smokeless  tobacco: Never Used     Comment: quit 07-2013  . Alcohol Use: Yes     Comment: rarely   OB History    Gravida Para Term Preterm AB TAB SAB Ectopic Multiple Living   '3 2   1     2     ' Review of Systems  Constitutional: Positive for diaphoresis. Negative for chills.  HENT: Negative for trouble swallowing.   Eyes: Negative for visual disturbance.  Respiratory: Negative for chest tightness and shortness of breath.   Cardiovascular: Negative for chest pain.  Gastrointestinal: Negative for nausea and vomiting.  Musculoskeletal: Positive for neck pain.  Neurological: Positive for dizziness, weakness (left arm) and numbness (left arm ). Negative for  syncope, speech difficulty and headaches.      Allergies  Ciprofloxacin; Codeine; Demerol; Levofloxacin; Levofloxacin; Nortriptyline hcl; Oxycodone-acetaminophen; and Ultram  Home Medications   Prior to Admission medications   Medication Sig Start Date End Date Taking? Authorizing Provider  aspirin 81 MG tablet Take 81 mg by mouth daily.      Historical Provider, MD  azithromycin (ZITHROMAX Z-PAK) 250 MG tablet As directed 09/27/13   Colon Branch, MD  benzonatate (TESSALON) 200 MG capsule Take 1 capsule (200 mg total) by mouth 2 (two) times daily as needed for cough. 09/27/13   Colon Branch, MD  fish oil-omega-3 fatty acids 1000 MG capsule Take 2 g by mouth daily.      Historical Provider, MD  fluconazole (DIFLUCAN) 150 MG tablet Take 1 tablet (150 mg total) by mouth once. 09/27/13   Colon Branch, MD  metoprolol succinate (TOPROL-XL) 50 MG 24 hr tablet take 1 tablet by mouth once daily with OR IMMEDIATELY FOLLOWING A MEAL 03/02/14   Colon Branch, MD  Multiple Vitamin (MULTIVITAMIN) capsule Take 1 capsule by mouth daily.      Historical Provider, MD  nicotine (NICODERM CQ - DOSED IN MG/24 HOURS) 14 mg/24hr patch Place 1 patch (14 mg total) onto the skin daily. 12/03/13   Terrance Mass, MD  [facility] PO Take 2 tablets by mouth daily.    Historical Provider, MD  valACYclovir (VALTREX) 500 MG tablet Take 1 tablet (500 mg total) by mouth daily. 02/23/13   Colon Branch, MD   BP 124/69 mmHg  Pulse 63  Temp(Src) 97.8 F (36.6 C) (Oral)  Resp 15  Ht 5' 7.5" (1.715 m)  Wt 184 lb (83.462 kg)  BMI 28.38 kg/m2  SpO2 98% Physical Exam  Constitutional: She is oriented to person, place, and time. She appears well-developed and well-nourished. No distress.  HENT:  Head: Normocephalic and atraumatic.  Mouth/Throat: Oropharynx is clear and moist. No oropharyngeal exudate.  Eyes: Conjunctivae and EOM are normal. Pupils are equal, round, and reactive to light. Right eye exhibits no discharge. Left eye  exhibits no discharge.  EOMs intact with negative entrapement  Neck: Normal range of motion. Neck supple. No tracheal deviation present.    Negative deformities noted to the neck. Negative asymmetry. Mild discomfort upon palpation to the left side of the neck - tender upon palpation. Negative trismus. Discomfort upon palpation to the trapezius muscle.   Cardiovascular: Normal rate, regular rhythm and normal heart sounds.  Exam reveals no friction rub.   No murmur heard. Pulses:      Radial pulses are 2+ on the right side, and 2+ on the left side.       Dorsalis pedis pulses are 2+ on the right side, and  2+ on the left side.  Negative swelling or pitting edema noted to the lower extremities bilaterally  Pulmonary/Chest: Effort normal and breath sounds normal. No respiratory distress. She has no wheezes. She has no rales. She exhibits tenderness.    Patient is able to speak in full sentences without difficulty  Negative use of accessory muscles Negative stridor Negative pain upon palpation to the chest wall, negative crepitus upon palpation   Musculoskeletal: Normal range of motion. She exhibits tenderness. She exhibits no edema.  Full ROM to upper and lower extremities without difficulty noted, negative ataxia noted.  Lymphadenopathy:    She has no cervical adenopathy.  Neurological: She is alert and oriented to person, place, and time. No cranial nerve deficit. She exhibits normal muscle tone. Coordination normal.  Cranial nerves III-XII grossly intact Strength 5+/5+ to upper and lower extremities bilaterally with resistance applied, equal distribution noted Equal grip strength bilaterally  Strength intact to MCP, PIP, DIP joints of left hand Sensation intact with differentiation to sharp and dull touch  2 point discrimination to the radial and ulnar aspect to the digits of the left hand Negative arm drift Fine motor skills intact Gait proper, proper balance - negative sway, negative  drift, negative step-offs  Skin: Skin is warm and dry. No rash noted. She is not diaphoretic. No erythema.  Psychiatric: She has a normal mood and affect. Her behavior is normal. Thought content normal.  Nursing note and vitals reviewed.   ED Course  Procedures (including critical care time)  Results for orders placed or performed during the hospital encounter of 06/30/14  CBC  Result Value Ref Range   WBC 10.2 4.0 - 10.5 K/uL   RBC 4.93 3.87 - 5.11 MIL/uL   Hemoglobin 14.1 12.0 - 15.0 g/dL   HCT 42.7 36.0 - 46.0 %   MCV 86.6 78.0 - 100.0 fL   MCH 28.6 26.0 - 34.0 pg   MCHC 33.0 30.0 - 36.0 g/dL   RDW 13.6 11.5 - 15.5 %   Platelets 220 150 - 400 K/uL  Basic metabolic panel  Result Value Ref Range   Sodium 140 137 - 147 mEq/L   Potassium 4.0 3.7 - 5.3 mEq/L   Chloride 103 96 - 112 mEq/L   CO2 22 19 - 32 mEq/L   Glucose, Bld 95 70 - 99 mg/dL   BUN 15 6 - 23 mg/dL   Creatinine, Ser 0.72 0.50 - 1.10 mg/dL   Calcium 9.7 8.4 - 10.5 mg/dL   GFR calc non Af Amer >90 >90 mL/min   GFR calc Af Amer >90 >90 mL/min   Anion gap 15 5 - 15  Pro b natriuretic peptide (BNP)  Result Value Ref Range   Pro B Natriuretic peptide (BNP) 42.0 0 - 125 pg/mL  I-stat troponin, ED (not at [facility])  Result Value Ref Range   Troponin i, poc 0.00 0.00 - 0.08 ng/mL   Comment 3            Labs Review Labs Reviewed  CBC  BASIC METABOLIC PANEL  PRO B NATRIURETIC PEPTIDE  D-DIMER, QUANTITATIVE  I-STAT TROPOININ, ED    Imaging Review Dg Chest 2 View  06/30/2014   CLINICAL DATA:  Acute onset of left shoulder blade pain radiating to the left neck a nausea and dizziness  EXAM: CHEST  2 VIEW  COMPARISON:  None.  FINDINGS: No active infiltrate or effusion is seen. Mediastinal and hilar contours are unremarkable. The heart is within normal limits  in size. No bony abnormality is seen.  IMPRESSION: No active cardiopulmonary disease.   Electronically Signed   By: Ivar Drape M.D.   On: 06/30/2014 11:42      EKG Interpretation   Date/Time:  Thursday June 30 2014 10:43:14 EST Ventricular Rate:  63 PR Interval:  150 QRS Duration: 90 QT Interval:  396 QTC Calculation: 405 R Axis:   76 Text Interpretation:  Normal sinus rhythm Normal ECG No previous ECGs  available Confirmed by YAO  MD, DAVID ([id number]) on 06/30/2014 4:31:05 PM      4:34 PM This provider spoke with attending physician, Dr. Allie Bossier who saw and assessed patient. Recommended d-dimer and agreed with cards consult. Reported that if d-dimer elevated CT angiogram of head and neck will need to be performed.   4:57 PM This provider spoke with Trish, Cardiology - discussed case in great detail. Patient to be seen by cardiology.   6:17 PM This provider spoke with Dr. Allie Bossier, attending physician - as per physician recommended CT angiogram of head, neck, and chest.   6:20 PM This provider spoke with Hinton Dyer from [place] regarding CT angiogram of head, neck, and chest. Reported that patient cannot have this due to being in different areas.   6:28 PM This provider spoke with Dr. Darl Householder - recommended CT angiogram of head and neck to be performed.   8:36 PM Possible drop in pulse ox of 88% on room air. Patient placed on O2 therapy. Ambulated on room air - pulse ox 95%. Patient ambulated well while in the ED setting. Patient reported that she did not have any chest pain, shortness of breath, difficulty breathing. Patient has been monitored in the ED setting for at least 9 hours with negative complaints or changes to pulse ox - belief of the 88% to be an improper data placement.  Patient seen and assessed by Dr. Darl Householder again - as per physician does not believe patient to have a PE - does not recommend CT angiogram at this time. Patient denied chest pain, shortness of breath, difficulty breathing. As per attending physician, recommended patient to be discharged home with antibiotics for acute sinus infection, as well as muscle relaxers.   MDM   Final diagnoses:    Chest pain    Medications  sodium chloride 0.9 % bolus 1,000 mL (1,000 mLs Intravenous New Bag/Given 06/30/14 1627)   Filed Vitals:   06/30/14 1048 06/30/14 1345 06/30/14 1609  BP: 156/92 134/74 124/69  Pulse: 68 63 63  Temp: 97.8 F (36.6 C)    TempSrc: Oral    Resp: '17 18 15  ' Height: 5' 7.5" (1.715 m)    Weight: 184 lb (83.462 kg)    SpO2: 100% 99% 98%   EKG noted normal sinus rhythm with a heart rate of 63 bpm. I-STAT troponin negative elevation. Second troponin negative elevation. D-dimer elevated at 0.67. BNP negative elevation. CBC negative elevated leukocytosis. Hemoglobin 14.1, hematocrit 42.7. BMP unremarkable. Chest x-ray negative for acute cardiopulmonary disease. CT angiogram of head and neck negative findings. Air-fluid level in both maxillary sinuses suggesting acute infection. Patient seen and assessed by cardiology. As per cardiology, reported that if patient's vitals are stable in imaging unremarkable patient can be discharged home with exercise stress test to be performed as outpatient. Does not believe to represent unstable angina. As per Cardiology, medically cleared patient to be discharged home. Discussed Cardiology recommendation with attending physician, Dr. Darl Householder who is in accordance.   Negative findings of  dissection. CT angiogram of head and neck unremarkable. Negative findings of hypoxia, tachycardia, tachypnea noted while in ED setting. Patient seen by cardiology and attending physician-do not believe this to be PE. Suspicion to be musculoskeletal pain. D-dimer could be elevated secondary to acute sinusitis is noted on CT angiogram of the neck. Negative findings of dissection. Patient seen and assessed in ED by cardiology who recommends patient to be discharged home with outpatient exercise stress test. Discussed labs and imaging in great detail with patient who agrees to plan of care for discharge. Patient stable, afebrile. Patient not septic appearing. Discharged  patient. Discharge patient with muscle relaxer and antibiotics. Referred patient to primary care provider and cardiology. Recommended patient to follow-up with cardiology regarding exercise stress test to be performed as an outpatient. Discussed patient to rest and stay hydrated. Discussed with patient to apply heat and massage with icy hot ointment. Discussed with patient to avoid any physical or strenuous activity. Discussed with patient to closely monitor symptoms and if symptoms are to worsen or change to report back to the ED - strict return instructions given.  Patient agreed to plan of care, understood, all questions answered.   Jamse Mead, PA-C 06/30/14 2326  Wandra Arthurs, MD 07/01/14 [phone]

## 2014-06-30 NOTE — Telephone Encounter (Signed)
[name] [phone]  Teresa KobusAngel called with some dizziness, headache last night, still has tightness in shoulder area, and just not feeling right this morning.  Transferred call to Call a Nurse

## 2014-06-30 NOTE — Consult Note (Signed)
Patient ID: [name] MRN: [id number], DOB/AGE: 03-27-1971   Admit date: 06/30/2014   Primary Physician: Kathlene November, MD Primary Cardiologist: new to  - seen by C. Hilty, MD   Pt. Profile:  43 y/o female w/ a h/o HTN who presented to the ED today after presyncope and left shoulder/trapezius/neck pain beginning last night.  Problem List  Past Medical History  Diagnosis Date  . Nephrolithiasis 1999    rt kidney functions @ 40%, sees urology routinely (Dr.Peterson), kidney stones, recurrent  . Abortion history     x1  . Miscarriage     x1   . Abnormal vaginal Pap smear   . Anxiety   . BRCA1 negative 11/12/10    NEGATIVE MUTATION  . BRCA2 negative 11/12/10    NEGATIVE MUTATION  . Herpes simplex     HSV  . Nephrolithiasis   . Chronic cystitis   . Hypertension   . Tobacco abuse     Past Surgical History  Procedure Laterality Date  . Tonsillectomy    . Tubal ligation    . Cesarean section      X 2 W BTSP  . Endometrial ablation    . Laparoscopic total hysterectomy  6.21.2012    [facility]     Allergies  Allergies  Allergen Reactions  . Ciprofloxacin Anaphylaxis and Nausea And Vomiting  . Codeine Anaphylaxis and Nausea And Vomiting  . Demerol Anaphylaxis  . Hydrocodone Anaphylaxis and Hives  . Levofloxacin Anaphylaxis  . Levofloxacin Anaphylaxis  . Nortriptyline Hcl Anaphylaxis    REACTION: irritability--"anger"  . Ultram [Tramadol Hcl] Anaphylaxis  . Oxycodone-Acetaminophen Itching    HPI  43 y/o female w/o prior cardiac hx.  Over the past month, she has noted mild left ankle swelling at the end of the day.  She hasn't thought much about it but she has not had any persistent swelling, pain, or dyspnea.  She was otw in her USOH until last evening when she was in her kitchen baking muffins and had sudden onset of nausea, lightneadedness, and diaphoresis.  She was noted by her son to be pale.  She then developed tingling and numbness in her left shoulder and neck,  radiating down to her left elbow.  She sat and felt weak but nausea resolved.  She then went and got an ASA and within about 30 mins, nausea, dizziness, and lightheadedness resolved but tingling in her shoulder and weakness persisted.  She went to bed and was able to sleep reasonably well.  This morning when she awoke, she noted tightness in her left shoulder/trapezius/posterior neck area w/o associated Ss.  She took her morning meds including asa and metoprolol and called her PCP and spoke to the triage nurse.  She was then advised to go to the ER.  Here, troponins have been normal and ecg is non-acute.  Home Medications  Prior to Admission medications   Medication Sig Start Date End Date Taking? Authorizing Provider  aspirin 81 MG tablet Take 81 mg by mouth daily.     Yes Historical Provider, MD  fish oil-omega-3 fatty acids 1000 MG capsule Take 2 g by mouth daily.     Yes Historical Provider, MD  metoprolol succinate (TOPROL-XL) 50 MG 24 hr tablet take 1 tablet by mouth once daily with OR IMMEDIATELY FOLLOWING A MEAL 03/02/14  Yes Colon Branch, MD  Multiple Vitamin (MULTIVITAMIN) capsule Take 1 capsule by mouth daily.     Yes Historical Provider, MD  [facility]  [facility] PO Take 2 tablets by mouth daily.   Yes Historical Provider, MD  valACYclovir (VALTREX) 500 MG tablet Take 1 tablet (500 mg total) by mouth daily. Patient taking differently: Take 500 mg by mouth as needed.  02/23/13  Yes Colon Branch, MD  azithromycin (ZITHROMAX Z-PAK) 250 MG tablet As directed Patient not taking: Reported on 06/30/2014 09/27/13   Colon Branch, MD  benzonatate (TESSALON) 200 MG capsule Take 1 capsule (200 mg total) by mouth 2 (two) times daily as needed for cough. Patient not taking: Reported on 06/30/2014 09/27/13   Colon Branch, MD  fluconazole (DIFLUCAN) 150 MG tablet Take 1 tablet (150 mg total) by mouth once. Patient not taking: Reported on 06/30/2014 09/27/13   Colon Branch, MD    Family History  Family History  Problem  Relation Age of Onset  . Diabetes Father   . Hypertension Father   . Heart attack Father     First MI @ 65 - total of 45, deceased.  . Diabetes Mother   . Breast cancer Mother     deceased.  . Hypertension Mother   . Heart attack Mother     First MI @ 45, CABG in early 31's.  . Colon cancer Neg Hx   . Uterine cancer Neg Hx   . Ovarian cancer Neg Hx   . Breast cancer Maternal Grandmother     Social History  History   Social History  . Marital Status: Married    Spouse Name: N/A    Number of Children: 2  . Years of Education: N/A   Occupational History  . unemployed  [employer]   Social History Main Topics  . Smoking status: Current Every Day Smoker -- 1.00 packs/day    Types: Cigarettes  . Smokeless tobacco: Never Used     Comment: quit 07-2013  . Alcohol Use: Yes     Comment: rarely  . Drug Use: No     Comment: no recent marijuana  . Sexual Activity: Not Currently   Other Topics Concern  . Not on file   Social History Narrative   Lost mom 2014   Lost father Sonia Side 3/11   Lives locally with husband and son.             Review of Systems General:  No chills, fever, night sweats or weight changes.  Cardiovascular:  +++ presyncope, diaphoresis, left shoulder pain last night/this am.  No chest pain, dyspnea on exertion, edema, orthopnea, palpitations, paroxysmal nocturnal dyspnea. Dermatological: No rash, lesions/masses Respiratory: No cough, dyspnea Urologic: No hematuria, dysuria Abdominal:   +++ nausea last night.  No vomiting, diarrhea, bright red blood per rectum, melena, or hematemesis Neurologic:  No visual changes, wkns, changes in mental status. All other systems reviewed and are otherwise negative except as noted above.  Physical Exam  Blood pressure 139/92, pulse 72, temperature 97.8 F (36.6 C), temperature source Oral, resp. rate 21, height 5' 7.5" (1.715 m), weight 184 lb (83.462 kg), SpO2 97 %.  General: Pleasant, NAD Psych: Normal affect. Neuro:  Alert and oriented X 3. Moves all extremities spontaneously. HEENT: Normal  Neck: Supple without bruits or JVD. Lungs:  Resp regular and unlabored, CTA. Heart: RRR no s3, s4, or murmurs. Abdomen: Soft, non-tender, non-distended, BS + x 4.  Extremities: No clubbing, cyanosis or edema. DP/PT/Radials 2+ and equal bilaterally. MSK:  Very tender left trapezius/deltoid.  Labs  Troponin [facility] Test)  Recent Labs  06/30/14  [facility] 0.00    Recent Labs  06/30/14 1714  TROPONINI <0.30   Lab Results  Component Value Date   WBC 10.2 06/30/2014   HGB 14.1 06/30/2014   HCT 42.7 06/30/2014   MCV 86.6 06/30/2014   PLT 220 06/30/2014     Recent Labs Lab 06/30/14 1212  NA 140  K 4.0  CL 103  CO2 22  BUN 15  CREATININE 0.72  CALCIUM 9.7  GLUCOSE 95   Lab Results  Component Value Date   DDIMER 0.67* 06/30/2014     Radiology/Studies  Dg Chest 2 View  06/30/2014   CLINICAL DATA:  Acute onset of left shoulder blade pain radiating to the left neck a nausea and dizziness  EXAM: CHEST  2 VIEW  COMPARISON:  None.  FINDINGS: No active infiltrate or effusion is seen. Mediastinal and hilar contours are unremarkable. The heart is within normal limits in size. No bony abnormality is seen.  IMPRESSION: No active cardiopulmonary disease.   Electronically Signed   By: Ivar Drape M.D.   On: 06/30/2014 11:42    ECG  Rsr, 63, no acute st/t changes.  ASSESSMENT AND PLAN  1.  Left shoulder/neck pain:  Pt began having pain in her left shoulder/trap/neck last night following presyncopal episode associated with lightheadedness, diaphoresis, and nausea.  Left shoulder Ss have persisted all day today with focal pain and tenderness, which is easily reproducible with light palpation over the left trapezius and deltoid along with abduction of the left arm with internal rotation or the shoulder.  No acute ST/T changes on ECG and despite prolonged Ss, troponin has been normal.  Defer  further mgmt of MSK pain to ER staff.  D dimer is elevated however she has not been hypoxic, dyspneic, or tachycardic.  If ER w/u unrevealing, she can be d/c'd from our standpoint.  Due to her significant family history and risk factors including HTN and tob abuse, we will arrange for and exercise treadmill test to assess for ischemia and will contact her with that information.  2.  Presyncope:  Exact etiology unclear.  Ss sound vasovagal in nature.  ECG nl.  No events on monitor here.  3.  HTN:  Stable on home dose of metoprolol.   Signed, Murray Hodgkins, NP 06/30/2014, 7:03 PM

## 2014-06-30 NOTE — Discharge Instructions (Signed)
Please call your doctor for a followup appointment within 24-48 hours. When you talk to your doctor please let them know that you were seen in the emergency department and have them acquire all of your records so that they can discuss the findings with you and formulate a treatment plan to fully care for your new and ongoing problems. Please call and set-up an appointment with your primary care provider to be seen and re-assessed  Please call and set up an appointment with Dr. Rennis GoldenHilty, cardiologist, to have stress test performed as an outpatient Please rest and stay hydrated  Please take medications as prescribed and on a full stomach  Please massage with icy hot ointment and apply warm compressions Please continue to monitor symptoms closely and if symptoms are to worsen or change (fever greater than 101, chills, sweating, nausea, vomiting, chest pain, shortness of breathe, difficulty breathing, weakness, numbness, tingling, worsening or changes to pain pattern, dizziness, swelling, blurred vision, sudden loss of vision, weakness, fall, injury) please report back to the Emergency Department immediately.    Cervical Strain and Sprain (Whiplash) with Rehab Cervical strain and sprain are injuries that commonly occur with "whiplash" injuries. Whiplash occurs when the neck is forcefully whipped backward or forward, such as during a motor vehicle accident or during contact sports. The muscles, ligaments, tendons, discs, and nerves of the neck are susceptible to injury when this occurs. RISK FACTORS Risk of having a whiplash injury increases if:  Osteoarthritis of the spine.  Situations that make head or neck accidents or trauma more likely.  High-risk sports (football, rugby, wrestling, hockey, auto racing, gymnastics, diving, contact karate, or boxing).  Poor strength and flexibility of the neck.  Previous neck injury.  Poor tackling technique.  Improperly fitted or padded equipment. SYMPTOMS     Pain or stiffness in the front or back of neck or both.  Symptoms may present immediately or up to 24 hours after injury.  Dizziness, headache, nausea, and vomiting.  Muscle spasm with soreness and stiffness in the neck.  Tenderness and swelling at the injury site. PREVENTION  Learn and use proper technique (avoid tackling with the head, spearing, and head-butting; use proper falling techniques to avoid landing on the head).  Warm up and stretch properly before activity.  Maintain physical fitness:  Strength, flexibility, and endurance.  Cardiovascular fitness.  Wear properly fitted and padded protective equipment, such as padded soft collars, for participation in contact sports. PROGNOSIS  Recovery from cervical strain and sprain injuries is dependent on the extent of the injury. These injuries are usually curable in 1 week to 3 months with appropriate treatment.  RELATED COMPLICATIONS   Temporary numbness and weakness may occur if the nerve roots are damaged, and this may persist until the nerve has completely healed.  Chronic pain due to frequent recurrence of symptoms.  Prolonged healing, especially if activity is resumed too soon (before complete recovery). TREATMENT  Treatment initially involves the use of ice and medication to help reduce pain and inflammation. It is also important to perform strengthening and stretching exercises and modify activities that worsen symptoms so the injury does not get worse. These exercises may be performed at home or with a therapist. For patients who experience severe symptoms, a soft, padded collar may be recommended to be worn around the neck.  Improving your posture may help reduce symptoms. Posture improvement includes pulling your chin and abdomen in while sitting or standing. If you are sitting, sit in a firm  chair with your buttocks against the back of the chair. While sleeping, try replacing your pillow with a small towel rolled to  2 inches in diameter, or use a cervical pillow or soft cervical collar. Poor sleeping positions delay healing.  For patients with nerve root damage, which causes numbness or weakness, the use of a cervical traction apparatus may be recommended. Surgery is rarely necessary for these injuries. However, cervical strain and sprains that are present at birth (congenital) may require surgery. MEDICATION   If pain medication is necessary, nonsteroidal anti-inflammatory medications, such as aspirin and ibuprofen, or other minor pain relievers, such as acetaminophen, are often recommended.  Do not take pain medication for 7 days before surgery.  Prescription pain relievers may be given if deemed necessary by your caregiver. Use only as directed and only as much as you need. HEAT AND COLD:   Cold treatment (icing) relieves pain and reduces inflammation. Cold treatment should be applied for 10 to 15 minutes every 2 to 3 hours for inflammation and pain and immediately after any activity that aggravates your symptoms. Use ice packs or an ice massage.  Heat treatment may be used prior to performing the stretching and strengthening activities prescribed by your caregiver, physical therapist, or athletic trainer. Use a heat pack or a warm soak. SEEK MEDICAL CARE IF:   Symptoms get worse or do not improve in 2 weeks despite treatment.  New, unexplained symptoms develop (drugs used in treatment may produce side effects). EXERCISES RANGE OF MOTION (ROM) AND STRETCHING EXERCISES - Cervical Strain and Sprain These exercises may help you when beginning to rehabilitate your injury. In order to successfully resolve your symptoms, you must improve your posture. These exercises are designed to help reduce the forward-head and rounded-shoulder posture which contributes to this condition. Your symptoms may resolve with or without further involvement from your physician, physical therapist or athletic trainer. While  completing these exercises, remember:   Restoring tissue flexibility helps normal motion to return to the joints. This allows healthier, less painful movement and activity.  An effective stretch should be held for at least 20 seconds, although you may need to begin with shorter hold times for comfort.  A stretch should never be painful. You should only feel a gentle lengthening or release in the stretched tissue. STRETCH- Axial Extensors  Lie on your back on the floor. You may bend your knees for comfort. Place a rolled-up hand towel or dish towel, about 2 inches in diameter, under the part of your head that makes contact with the floor.  Gently tuck your chin, as if trying to make a "double chin," until you feel a gentle stretch at the base of your head.  Hold __________ seconds. Repeat __________ times. Complete this exercise __________ times per day.  STRETCH - Axial Extension   Stand or sit on a firm surface. Assume a good posture: chest up, shoulders drawn back, abdominal muscles slightly tense, knees unlocked (if standing) and feet hip width apart.  Slowly retract your chin so your head slides back and your chin slightly lowers. Continue to look straight ahead.  You should feel a gentle stretch in the back of your head. Be certain not to feel an aggressive stretch since this can cause headaches later.  Hold for __________ seconds. Repeat __________ times. Complete this exercise __________ times per day. STRETCH - Cervical Side Bend   Stand or sit on a firm surface. Assume a good posture: chest up, shoulders drawn back,  abdominal muscles slightly tense, knees unlocked (if standing) and feet hip width apart.  Without letting your nose or shoulders move, slowly tip your right / left ear to your shoulder until your feel a gentle stretch in the muscles on the opposite side of your neck.  Hold __________ seconds. Repeat __________ times. Complete this exercise __________ times per  day. STRETCH - Cervical Rotators   Stand or sit on a firm surface. Assume a good posture: chest up, shoulders drawn back, abdominal muscles slightly tense, knees unlocked (if standing) and feet hip width apart.  Keeping your eyes level with the ground, slowly turn your head until you feel a gentle stretch along the back and opposite side of your neck.  Hold __________ seconds. Repeat __________ times. Complete this exercise __________ times per day. RANGE OF MOTION - Neck Circles   Stand or sit on a firm surface. Assume a good posture: chest up, shoulders drawn back, abdominal muscles slightly tense, knees unlocked (if standing) and feet hip width apart.  Gently roll your head down and around from the back of one shoulder to the back of the other. The motion should never be forced or painful.  Repeat the motion 10-20 times, or until you feel the neck muscles relax and loosen. Repeat __________ times. Complete the exercise __________ times per day. STRENGTHENING EXERCISES - Cervical Strain and Sprain These exercises may help you when beginning to rehabilitate your injury. They may resolve your symptoms with or without further involvement from your physician, physical therapist, or athletic trainer. While completing these exercises, remember:   Muscles can gain both the endurance and the strength needed for everyday activities through controlled exercises.  Complete these exercises as instructed by your physician, physical therapist, or athletic trainer. Progress the resistance and repetitions only as guided.  You may experience muscle soreness or fatigue, but the pain or discomfort you are trying to eliminate should never worsen during these exercises. If this pain does worsen, stop and make certain you are following the directions exactly. If the pain is still present after adjustments, discontinue the exercise until you can discuss the trouble with your clinician. STRENGTH - Cervical  Flexors, Isometric  Face a wall, standing about 6 inches away. Place a small pillow, a ball about 6-8 inches in diameter, or a folded towel between your forehead and the wall.  Slightly tuck your chin and gently push your forehead into the soft object. Push only with mild to moderate intensity, building up tension gradually. Keep your jaw and forehead relaxed.  Hold 10 to 20 seconds. Keep your breathing relaxed.  Release the tension slowly. Relax your neck muscles completely before you start the next repetition. Repeat __________ times. Complete this exercise __________ times per day. STRENGTH- Cervical Lateral Flexors, Isometric   Stand about 6 inches away from a wall. Place a small pillow, a ball about 6-8 inches in diameter, or a folded towel between the side of your head and the wall.  Slightly tuck your chin and gently tilt your head into the soft object. Push only with mild to moderate intensity, building up tension gradually. Keep your jaw and forehead relaxed.  Hold 10 to 20 seconds. Keep your breathing relaxed.  Release the tension slowly. Relax your neck muscles completely before you start the next repetition. Repeat __________ times. Complete this exercise __________ times per day. STRENGTH - Cervical Extensors, Isometric   Stand about 6 inches away from a wall. Place a small pillow, a ball about  6-8 inches in diameter, or a folded towel between the back of your head and the wall.  Slightly tuck your chin and gently tilt your head back into the soft object. Push only with mild to moderate intensity, building up tension gradually. Keep your jaw and forehead relaxed.  Hold 10 to 20 seconds. Keep your breathing relaxed.  Release the tension slowly. Relax your neck muscles completely before you start the next repetition. Repeat __________ times. Complete this exercise __________ times per day. POSTURE AND BODY MECHANICS CONSIDERATIONS - Cervical Strain and Sprain Keeping correct  posture when sitting, standing or completing your activities will reduce the stress put on different body tissues, allowing injured tissues a chance to heal and limiting painful experiences. The following are general guidelines for improved posture. Your physician or physical therapist will provide you with any instructions specific to your needs. While reading these guidelines, remember:  The exercises prescribed by your provider will help you have the flexibility and strength to maintain correct postures.  The correct posture provides the optimal environment for your joints to work. All of your joints have less wear and tear when properly supported by a spine with good posture. This means you will experience a healthier, less painful body.  Correct posture must be practiced with all of your activities, especially prolonged sitting and standing. Correct posture is as important when doing repetitive low-stress activities (typing) as it is when doing a single heavy-load activity (lifting). PROLONGED STANDING WHILE SLIGHTLY LEANING FORWARD When completing a task that requires you to lean forward while standing in one place for a long time, place either foot up on a stationary 2- to 4-inch high object to help maintain the best posture. When both feet are on the ground, the low back tends to lose its slight inward curve. If this curve flattens (or becomes too large), then the back and your other joints will experience too much stress, fatigue more quickly, and can cause pain.  RESTING POSITIONS Consider which positions are most painful for you when choosing a resting position. If you have pain with flexion-based activities (sitting, bending, stooping, squatting), choose a position that allows you to rest in a less flexed posture. You would want to avoid curling into a fetal position on your side. If your pain worsens with extension-based activities (prolonged standing, working overhead), avoid resting in an  extended position such as sleeping on your stomach. Most people will find more comfort when they rest with their spine in a more neutral position, neither too rounded nor too arched. Lying on a non-sagging bed on your side with a pillow between your knees, or on your back with a pillow under your knees will often provide some relief. Keep in mind, being in any one position for a prolonged period of time, no matter how correct your posture, can still lead to stiffness. WALKING Walk with an upright posture. Your ears, shoulders, and hips should all line up. OFFICE WORK When working at a desk, create an environment that supports good, upright posture. Without extra support, muscles fatigue and lead to excessive strain on joints and other tissues. CHAIR:  A chair should be able to slide under your desk when your back makes contact with the back of the chair. This allows you to work closely.  The chair's height should allow your eyes to be level with the upper part of your monitor and your hands to be slightly lower than your elbows.  Body position:  Your feet  should make contact with the floor. If this is not possible, use a foot rest.  Keep your ears over your shoulders. This will reduce stress on your neck and low back. Document Released: 07/29/2005 Document Revised: 12/13/2013 Document Reviewed: 11/10/2008 [facility] Patient Information 2015 [place], [place]. This information is not intended to replace advice given to you by your health care provider. Make sure you discuss any questions you have with your health care provider. Sinusitis Sinusitis is redness, soreness, and inflammation of the paranasal sinuses. Paranasal sinuses are air pockets within the bones of your face (beneath the eyes, the middle of the forehead, or above the eyes). In healthy paranasal sinuses, mucus is able to drain out, and air is able to circulate through them by way of your nose. However, when your paranasal sinuses are  inflamed, mucus and air can become trapped. This can allow bacteria and other germs to grow and cause infection. Sinusitis can develop quickly and last only a short time (acute) or continue over a long period (chronic). Sinusitis that lasts for more than 12 weeks is considered chronic.  CAUSES  Causes of sinusitis include:  Allergies.  Structural abnormalities, such as displacement of the cartilage that separates your nostrils (deviated septum), which can decrease the air flow through your nose and sinuses and affect sinus drainage.  Functional abnormalities, such as when the small hairs (cilia) that line your sinuses and help remove mucus do not work properly or are not present. SIGNS AND SYMPTOMS  Symptoms of acute and chronic sinusitis are the same. The primary symptoms are pain and pressure around the affected sinuses. Other symptoms include:  Upper toothache.  Earache.  Headache.  Bad breath.  Decreased sense of smell and taste.  A cough, which worsens when you are lying flat.  Fatigue.  Fever.  Thick drainage from your nose, which often is green and may contain pus (purulent).  Swelling and warmth over the affected sinuses. DIAGNOSIS  Your health care provider will perform a physical exam. During the exam, your health care provider may:  Look in your nose for signs of abnormal growths in your nostrils (nasal polyps).  Tap over the affected sinus to check for signs of infection.  View the inside of your sinuses (endoscopy) using an imaging device that has a light attached (endoscope). If your health care provider suspects that you have chronic sinusitis, one or more of the following tests may be recommended:  Allergy tests.  Nasal culture. A sample of mucus is taken from your nose, sent to a lab, and screened for bacteria.  Nasal cytology. A sample of mucus is taken from your nose and examined by your health care provider to determine if your sinusitis is related to  an allergy. TREATMENT  Most cases of acute sinusitis are related to a viral infection and will resolve on their own within 10 days. Sometimes medicines are prescribed to help relieve symptoms (pain medicine, decongestants, nasal steroid sprays, or saline sprays).  However, for sinusitis related to a bacterial infection, your health care provider will prescribe antibiotic medicines. These are medicines that will help kill the bacteria causing the infection.  Rarely, sinusitis is caused by a fungal infection. In theses cases, your health care provider will prescribe antifungal medicine. For some cases of chronic sinusitis, surgery is needed. Generally, these are cases in which sinusitis recurs more than 3 times per year, despite other treatments. HOME CARE INSTRUCTIONS   Drink plenty of water. Water helps thin the mucus  so your sinuses can drain more easily.  Use a humidifier.  Inhale steam 3 to 4 times a day (for example, sit in the bathroom with the shower running).  Apply a warm, moist washcloth to your face 3 to 4 times a day, or as directed by your health care provider.  Use saline nasal sprays to help moisten and clean your sinuses.  Take medicines only as directed by your health care provider.  If you were prescribed either an antibiotic or antifungal medicine, finish it all even if you start to feel better. SEEK IMMEDIATE MEDICAL CARE IF:  You have increasing pain or severe headaches.  You have nausea, vomiting, or drowsiness.  You have swelling around your face.  You have vision problems.  You have a stiff neck.  You have difficulty breathing. MAKE SURE YOU:   Understand these instructions.  Will watch your condition.  Will get help right away if you are not doing well or get worse. Document Released: 07/29/2005 Document Revised: 12/13/2013 Document Reviewed: 08/13/2011 [facility] Patient Information 2015 [place]. This information is not intended to replace  advice given to you by your health care provider. Make sure you discuss any questions you have with your health care provider.

## 2014-06-30 NOTE — ED Notes (Signed)
MD at bedside. 

## 2014-06-30 NOTE — Telephone Encounter (Signed)
Noted, please check on her tomorrow

## 2014-06-30 NOTE — ED Notes (Signed)
Pt states she was making muffins last night at 1930 when she experienced acute onset L shoulder blade pain that radiated to L neck and L shoulder.  Pt immediately became nauseated and dizzy.  Pt took a baby asa her bp meds and began to feel better.  Pt still feels a tightness.  Denies sob.

## 2014-06-30 NOTE — Telephone Encounter (Signed)
Patient Information:  Caller Name: Burna MortimerWanda  Phone: [phone]  Patient: [name], [name]  Gender: Female  DOB: 1971-04-30  Age: 43 Years  PCP: Reather LittlerKumar, Ajay  Pregnant: No  Office Follow Up:  Does the office need to follow up with this patient?: No  Instructions For The Office: N/A  RN Note:  Hx hysterectomy.  Called from work. No nausea, shortness of or diaphroesis present.  Smokes 1 ppd. Instruced to take 3 baby ASA now.  Do not smoke.  Advised to call 911 now for chest pain > 5 minutes now and age > 1930 with history of HTN and smoker per Chest Pain.  Verbalized understanding.  Symptoms  Reason For Call & Symptoms: Transferred from Wanda/office for immediate triage:  Episode of sudden, intense dizziness that occurred after cooking about 1930 06/29/14 followed by a wave of nausea.  Had to sit down, then  reported an "intense feeling" of tingling and numb sensation in left arm and shoulder for 20 minutes.  Felt weak so went to bed. Today, 06/30/14,  has "uncomfortable tightness" on back of left arm, left shoulder blade and left side of neck.  Feeling very anxious: reports she is unable to control her emotions.  Reviewed Health History In EMR: Yes  Reviewed Medications In EMR: Yes  Reviewed Allergies In EMR: Yes  Reviewed Surgeries / Procedures: Yes  Date of Onset of Symptoms: 06/29/2014  Treatments Tried: extra dose of ASA 81 mg, sat down to rest  Treatments Tried Worked: Yes OB / GYN:  LMP: Unknown  Guideline(Shaw) Used:  Chest Pain  Disposition Per Guideline:   Call EMS 911 Now  Reason For Disposition Reached:   Chest pain lasting longer than 5 minutes and ANY of the following:  Over 43 years old Over 43 years old and at least one cardiac risk factor (i.e., high blood pressure, diabetes, high cholesterol, obesity, smoker or strong family history of heart disease) Pain is crushing, pressure-like, or heavy  Took nitroglycerin and chest pain was not relieved History of heart  disease (i.e., angina, heart attack, bypass surgery, angioplasty, CHF)  Advice Given:  N/A  Patient Will Follow Care Advice:  YES

## 2014-06-30 NOTE — Telephone Encounter (Signed)
FYI

## 2014-06-30 NOTE — ED Notes (Signed)
Patient transported to CT 

## 2014-07-01 LAB — POC URINE PREG, ED: PREG TEST UR: NEGATIVE

## 2014-07-01 NOTE — Telephone Encounter (Signed)
LMOM for Pt to return call. Calling Pt to see how she is doing.

## 2014-07-04 ENCOUNTER — Encounter (HOSPITAL_COMMUNITY): Payer: Self-pay | Admitting: *Deleted

## 2014-07-28 ENCOUNTER — Telehealth (HOSPITAL_COMMUNITY): Payer: Self-pay

## 2014-07-28 NOTE — Telephone Encounter (Signed)
Encounter complete. 

## 2014-07-29 ENCOUNTER — Telehealth (HOSPITAL_COMMUNITY): Payer: Self-pay

## 2014-07-29 NOTE — Telephone Encounter (Signed)
Encounter complete. 

## 2014-08-02 ENCOUNTER — Encounter (HOSPITAL_COMMUNITY): Payer: BC Managed Care – PPO

## 2014-11-25 ENCOUNTER — Other Ambulatory Visit: Payer: Self-pay | Admitting: Internal Medicine

## 2014-12-20 ENCOUNTER — Other Ambulatory Visit: Payer: Self-pay

## 2014-12-21 ENCOUNTER — Ambulatory Visit (INDEPENDENT_AMBULATORY_CARE_PROVIDER_SITE_OTHER): Payer: BC Managed Care – PPO | Admitting: Internal Medicine

## 2014-12-21 ENCOUNTER — Encounter: Payer: Self-pay | Admitting: Internal Medicine

## 2014-12-21 VITALS — BP 118/78 | HR 70 | Temp 98.0°F | Ht 68.0 in | Wt 199.1 lb

## 2014-12-21 DIAGNOSIS — A6000 Herpesviral infection of urogenital system, unspecified: Secondary | ICD-10-CM | POA: Diagnosis not present

## 2014-12-21 DIAGNOSIS — Z720 Tobacco use: Secondary | ICD-10-CM

## 2014-12-21 DIAGNOSIS — Z0000 Encounter for general adult medical examination without abnormal findings: Secondary | ICD-10-CM

## 2014-12-21 DIAGNOSIS — F17200 Nicotine dependence, unspecified, uncomplicated: Secondary | ICD-10-CM

## 2014-12-21 DIAGNOSIS — I10 Essential (primary) hypertension: Secondary | ICD-10-CM

## 2014-12-21 MED ORDER — VALACYCLOVIR HCL 500 MG PO TABS: 500.0000 mg | ORAL_TABLET | Freq: Every day | ORAL | Status: DC | PRN

## 2014-12-21 MED ORDER — METOPROLOL SUCCINATE ER 50 MG PO TB24: 50.0000 mg | ORAL_TABLET | Freq: Every day | ORAL | Status: DC

## 2014-12-21 NOTE — Patient Instructions (Signed)
  Please schedule labs to be done within few days (fasting)   Come back to the office in 1 year   for a physical exam  Please schedule an appointment at the front desk    Come back fasting       

## 2014-12-21 NOTE — Progress Notes (Signed)
Subjective:    Patient ID: [name], female    DOB: 07-29-1971, 44 y.o.   MRN: [id number]  DOS:  12/21/2014 Type of visit - description : rov Interval history:  Patient is a 44 year old female smoker with a history of hypertension and anxiety in today for a routine office visit.    Hypertension: Patient states that this is well-controlled, she has been doing a great job of avoiding salt-heavy foods. She no longer adds salt to food and uses no salt while cooking. She notes that when she does eat salty foods her left leg will swell. She denies any chest pain, shortness of breath, or vision changes.  Anxiety: Patient notes no problems with this. She states no need for medications.   Herpes: Patient notes that she does have attacks occasionally but takes Valtrex as needed which works to clear it up. She notes she has run out of medication and would like a refill.   Smoker: Patient went through a period where she had quit smoking but has since relapsed. She is currently down to 1/2 ppd. She states that she has been using lozenges to help with cessation. She knows she needs to quit but is struggling with the mental aspect of avoiding triggering routines. She denies any shortness of breath or cough.     Review of Systems  Constitutional: No fever, chills. Respiratory: No wheezing or difficulty breathing. No cough , mucus production Cardiovascular: No CP or palpitations.  GI: no nausea, vomiting, diarrhea or abdominal pain.  No blood in the stools. No dysphagia  GU: No dysuria, gross hematuria, difficulty urinating. No urinary urgency or frequency. Neurological: No dizziness or diplopia. Mild stable morning headaches.  Past Medical History  Diagnosis Date  . Nephrolithiasis 1999    rt kidney functions @ 40%, sees urology routinely (Dr.Peterson), kidney stones, recurrent  . Abortion history     x1  . Miscarriage     x1   . Abnormal vaginal Pap smear   . Anxiety   . BRCA1 negative  11/12/10    NEGATIVE MUTATION  . BRCA2 negative 11/12/10    NEGATIVE MUTATION  . Herpes simplex     HSV  . Nephrolithiasis   . Chronic cystitis   . Hypertension   . Tobacco abuse     Past Surgical History  Procedure Laterality Date  . Tonsillectomy    . Tubal ligation    . Cesarean section      X 2 W BTSP  . Endometrial ablation    . Laparoscopic total hysterectomy  6.21.2012    [facility]    History   Social History  . Marital Status: Married    Spouse Name: N/A  . Number of Children: 2  . Years of Education: N/A   Occupational History  . office manager [employer]   Social History Main Topics  . Smoking status: Current Every Day Smoker -- 1.00 packs/day    Types: Cigarettes  . Smokeless tobacco: Never Used     Comment: quit 07-2013  . Alcohol Use: Yes     Comment: rarely  . Drug Use: No     Comment: no recent marijuana  . Sexual Activity: Not Currently   Other Topics Concern  . Not on file   Social History Narrative   Lost mom 2014   Lost father Sonia Side 3/11   Lives locally with husband and 2 sons. (90 and 13 y/o boys)  Family History  Problem Relation Age of Onset  . Diabetes Father   . Hypertension Father   . Heart attack Father     First MI @ 9 - total of 71, deceased.  . Diabetes Mother   . Breast cancer Mother     deceased.  . Hypertension Mother   . Heart attack Mother     First MI @ 30, CABG in early 74's.  . Colon cancer Neg Hx   . Uterine cancer Neg Hx   . Ovarian cancer Neg Hx   . Breast cancer Maternal Grandmother         Medication List       This list is accurate as of: 12/21/14 11:59 PM.  Always use your most recent med list.               aspirin 81 MG tablet  Take 81 mg by mouth daily.     fish oil-omega-3 fatty acids 1000 MG capsule  Take 2 g by mouth daily.     metoprolol succinate 50 MG 24 hr tablet  Commonly known as:  TOPROL-XL  Take 1 tablet (50 mg total) by mouth daily.     multivitamin capsule  Take 1  capsule by mouth daily.     ST JOHNS WORT PO  Take 2 tablets by mouth daily.     valACYclovir 500 MG tablet  Commonly known as:  VALTREX  Take 1 tablet (500 mg total) by mouth daily as needed.           Objective:   Physical Exam BP 118/78 mmHg  Pulse 70  Temp(Src) 98 F (36.7 C) (Oral)  Ht '5\' 8"'  (1.727 m)  Wt 199 lb 2 oz (90.323 kg)  BMI 30.28 kg/m2  SpO2 98%  General:   Well developed, well nourished . NAD.  HEENT:  Normocephalic . Face symmetric, atraumatic Lungs:  CTA B Normal respiratory effort, no intercostal retractions, no accessory muscle use. Heart: RRR,  no murmur.  No pretibial edema bilaterally  Skin: Not pale. Not jaundice Neurologic:  alert & oriented X3.  Speech normal, gait appropriate for age and unassisted Psych--  Cognition and judgment appear intact.  Cooperative with normal attention span and concentration.  Behavior appropriate. No anxious or depressed appearing.     Assessment & Plan:   (Patient seen along with   Aletta Edouard, medical student)   Anxiety: Well-controlled, patient instructed to let us know if anxiety worsens.

## 2014-12-21 NOTE — Assessment & Plan Note (Signed)
Td 2009  Saw gyn 2015

## 2014-12-21 NOTE — Progress Notes (Signed)
Pre visit review using our clinic review tool, if applicable. No additional management support is needed unless otherwise documented below in the visit note. 

## 2014-12-22 NOTE — Assessment & Plan Note (Signed)
Hypertension: Well controlled, no changes to meds. Patient doing well monitoring salt intake. Encouraged continuation of a heart healthy diet and exercise as tolerated. Will refill medications today. Patient instructed to return fasting in the next few days for FLP and TSH check.

## 2014-12-22 NOTE — Assessment & Plan Note (Signed)
Genital Herpes: Well-controlled with Valtrex prn. Will refill medication today.

## 2014-12-22 NOTE — Assessment & Plan Note (Signed)
Smoking: Patient notes desire to quit smoking but admits difficulty. We instructed patient on importance and benefit of replacing her routine of smoking with various healthy habits. Patient has cut back to less than 1/2 ppd  Patient declined nicotine patch but is using lozenges .  Patient in pneumonia risk group, offered Pneumovax, patient declined.  We discussed medications but she is not ready for that

## 2015-08-30 IMAGING — CR DG CHEST 2V
2 series · 2 of 2 positions shown · non-contrast
Comparison: None.

CLINICAL DATA: Acute onset of left shoulder blade pain radiating to
the left neck a nausea and dizziness

EXAM:
CHEST  2 VIEW

[w chest pa]
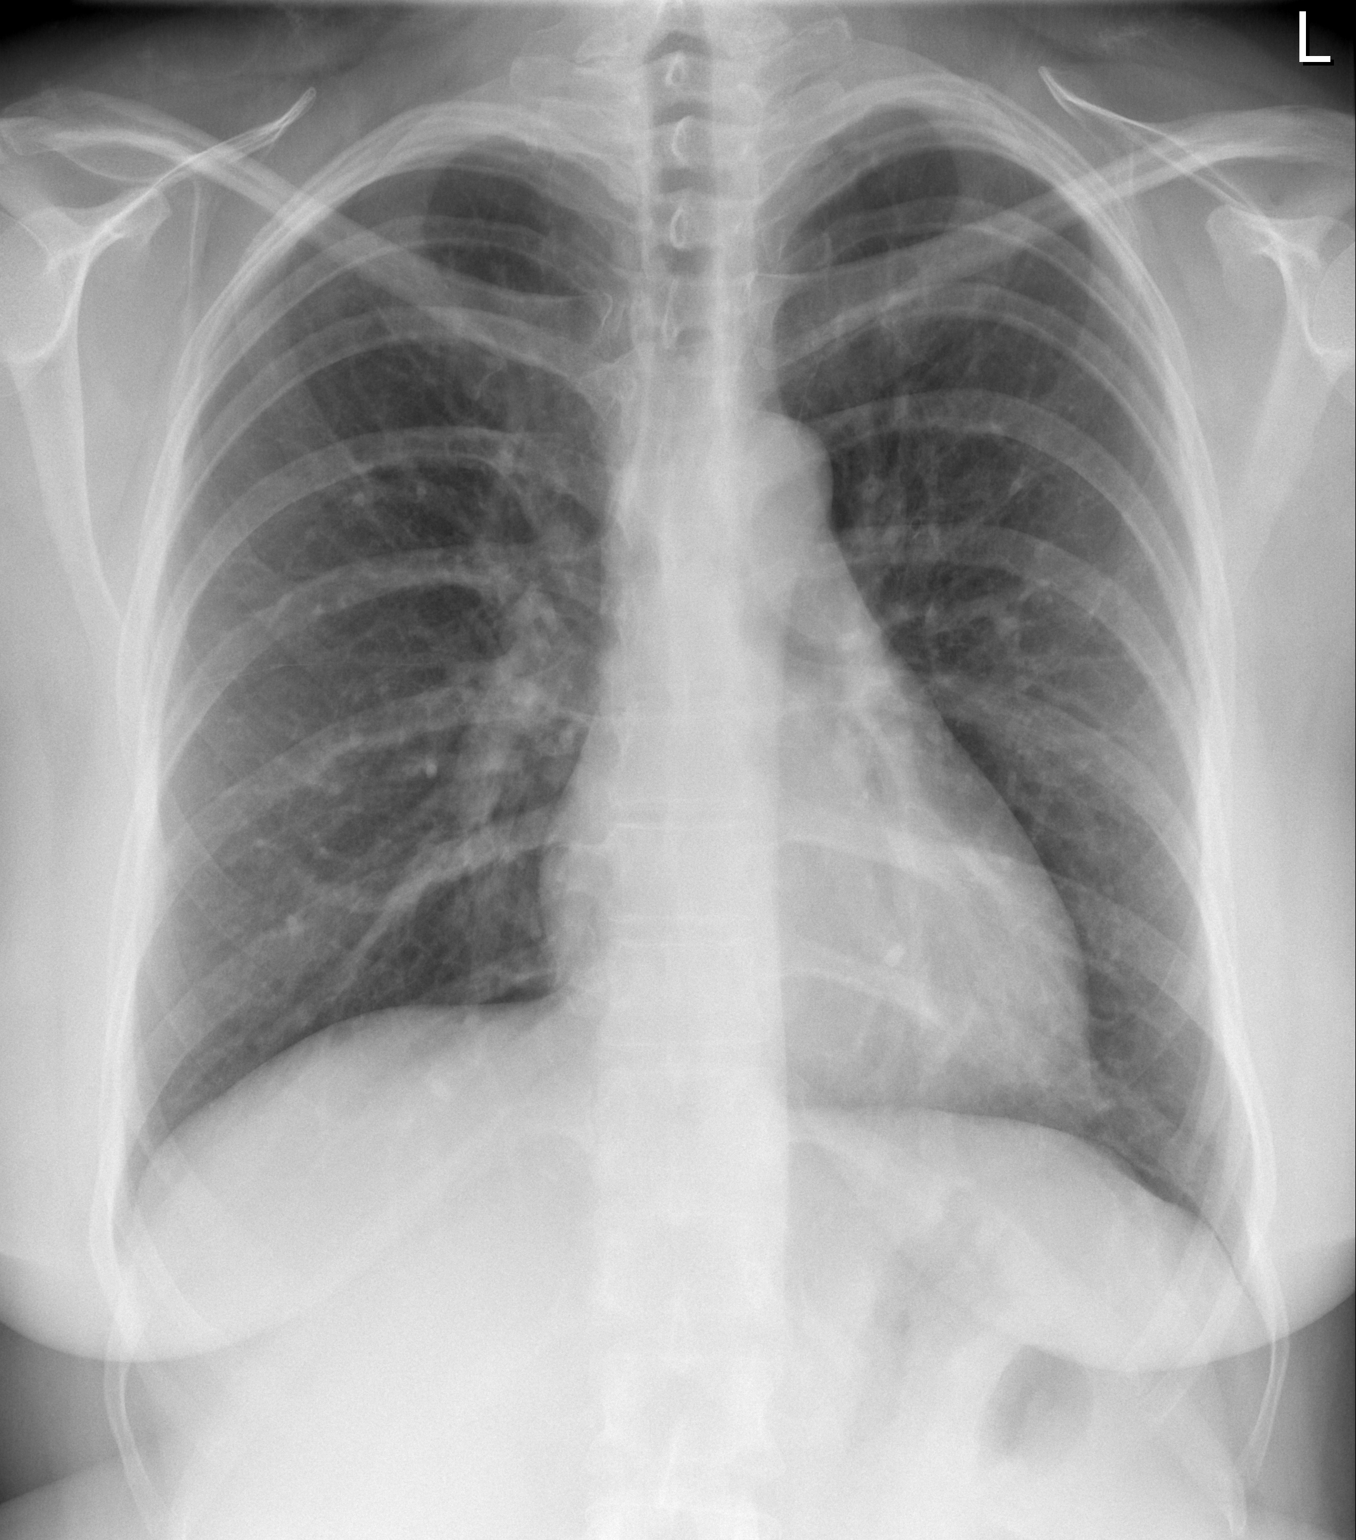

[w chest lat]
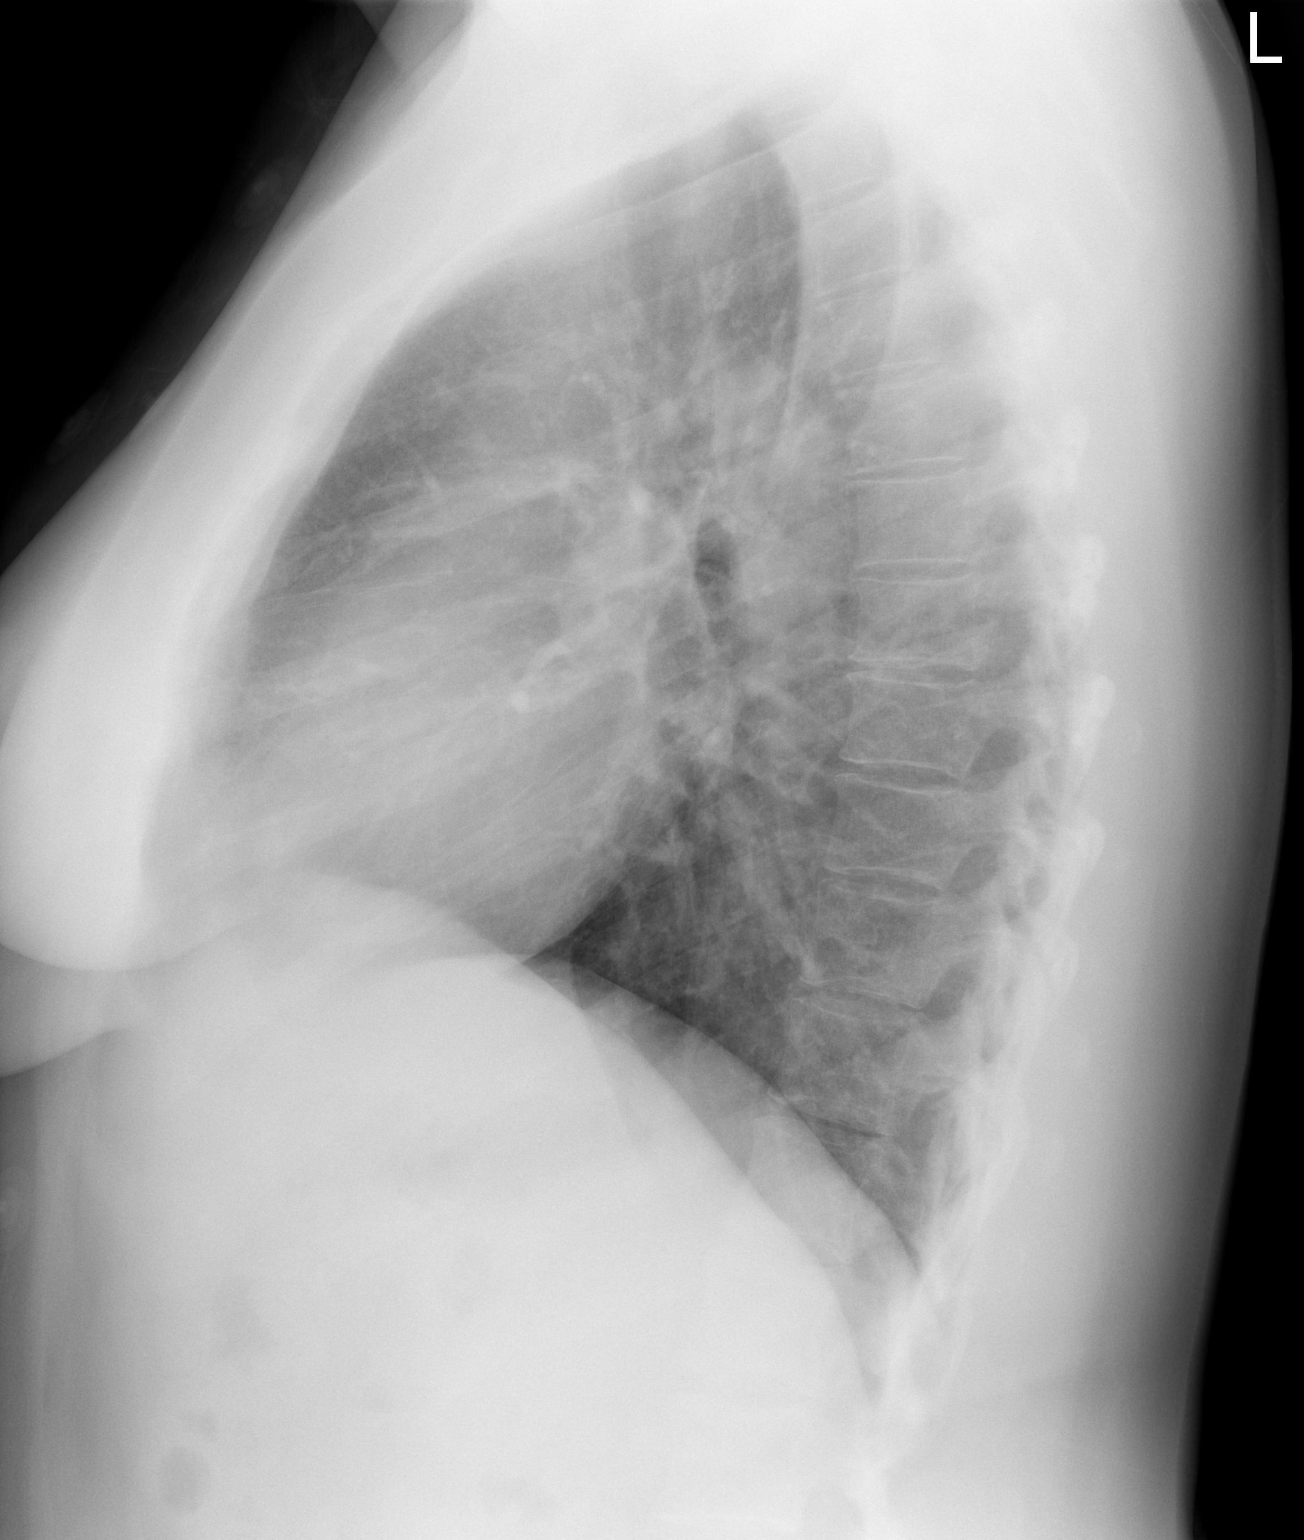

[2 of 2 positions shown; findings below may reference images not displayed]

FINDINGS: No active infiltrate or effusion is seen. Mediastinal and hilar
contours are unremarkable. The heart is within normal limits in
size. No bony abnormality is seen.
IMPRESSION: No active cardiopulmonary disease.

## 2015-12-25 ENCOUNTER — Encounter: Payer: Self-pay | Admitting: Internal Medicine

## 2015-12-25 ENCOUNTER — Ambulatory Visit (INDEPENDENT_AMBULATORY_CARE_PROVIDER_SITE_OTHER): Payer: Commercial Managed Care - PPO | Admitting: Internal Medicine

## 2015-12-25 VITALS — BP 118/68 | HR 61 | Temp 97.6°F | Ht 68.0 in | Wt 195.0 lb

## 2015-12-25 DIAGNOSIS — Z0000 Encounter for general adult medical examination without abnormal findings: Secondary | ICD-10-CM

## 2015-12-25 DIAGNOSIS — Z09 Encounter for follow-up examination after completed treatment for conditions other than malignant neoplasm: Secondary | ICD-10-CM

## 2015-12-25 DIAGNOSIS — L989 Disorder of the skin and subcutaneous tissue, unspecified: Secondary | ICD-10-CM

## 2015-12-25 LAB — COMPREHENSIVE METABOLIC PANEL
ALT: 13 U/L (ref 0–35)
AST: 13 U/L (ref 0–37)
Albumin: 4.6 g/dL (ref 3.5–5.2)
Alkaline Phosphatase: 94 U/L (ref 39–117)
BILIRUBIN TOTAL: 0.3 mg/dL (ref 0.2–1.2)
BUN: 12 mg/dL (ref 6–23)
CALCIUM: 9.6 mg/dL (ref 8.4–10.5)
CHLORIDE: 108 meq/L (ref 96–112)
CO2: 25 mEq/L (ref 19–32)
Creatinine, Ser: 0.88 mg/dL (ref 0.40–1.20)
GFR: 73.97 mL/min (ref >60.00)
Glucose, Bld: 110 mg/dL — ABNORMAL HIGH (ref 70–99)
POTASSIUM: 4 meq/L (ref 3.5–5.1)
Sodium: 141 mEq/L (ref 135–145)
Total Protein: 7.8 g/dL (ref 6.0–8.3)

## 2015-12-25 LAB — CBC WITH DIFFERENTIAL/PLATELET
Basophils Absolute: 0.1 10*3/uL (ref 0.0–0.1)
Basophils Relative: 0.5 % (ref 0.0–3.0)
Eosinophils Absolute: 0.2 10*3/uL (ref 0.0–0.7)
Eosinophils Relative: 2.3 % (ref 0.0–5.0)
HCT: 46.6 % — ABNORMAL HIGH (ref 36.0–46.0)
Hemoglobin: 15.8 g/dL — ABNORMAL HIGH (ref 12.0–15.0)
Lymphocytes Relative: 29.5 % (ref 12.0–46.0)
Lymphs Abs: 3.1 10*3/uL (ref 0.7–4.0)
MCHC: 33.9 g/dL (ref 30.0–36.0)
MCV: 86.6 fl (ref 78.0–100.0)
MONO ABS: 0.4 10*3/uL (ref 0.1–1.0)
MONOS PCT: 4.1 % (ref 3.0–12.0)
NEUTROS ABS: 6.6 10*3/uL (ref 1.4–7.7)
NEUTROS PCT: 63.6 % (ref 43.0–77.0)
PLATELETS: 248 10*3/uL (ref 150.0–400.0)
RBC: 5.39 Mil/uL — ABNORMAL HIGH (ref 3.87–5.11)
RDW: 13.6 % (ref 11.5–15.5)
WBC: 10.4 10*3/uL (ref 4.0–10.5)

## 2015-12-25 LAB — LIPID PANEL
Cholesterol: 239 mg/dL — ABNORMAL HIGH (ref 0–200)
HDL: 32.3 mg/dL — ABNORMAL LOW (ref >39.00)
LDL Cholesterol: 175 mg/dL — ABNORMAL HIGH (ref 0–99)
NonHDL: 206.89
TRIGLYCERIDES: 160 mg/dL — AB (ref 0.0–149.0)
Total CHOL/HDL Ratio: 7
VLDL: 32 mg/dL (ref 0.0–40.0)

## 2015-12-25 LAB — TSH: TSH: 2.59 u[IU]/mL (ref 0.35–4.50)

## 2015-12-25 MED ORDER — CLONAZEPAM 0.5 MG PO TABS: 0.5000 mg | ORAL_TABLET | Freq: Two times a day (BID) | ORAL | Status: DC | PRN

## 2015-12-25 NOTE — Progress Notes (Signed)
Pre visit review using our clinic review tool, if applicable. No additional management support is needed unless otherwise documented below in the visit note. 

## 2015-12-25 NOTE — Progress Notes (Signed)
Subjective:    Patient ID: [name], female    DOB: 1971/05/15, 45 y.o.   MRN: [id number]  DOS:  12/25/2015 Type of visit - description : CPX Interval history: We discuss several issues in addition to her physical exam.  Wt Readings from Last 3 Encounters:  12/25/15 195 lb (88.451 kg)  12/21/14 199 lb 2 oz (90.323 kg)  06/30/14 184 lb (83.462 kg)     Review of Systems  Constitutional: No fever. No chills. No unexplained wt changes. No unusual sweats  HEENT: No dental problems, no ear discharge, no facial swelling, no voice changes. No eye discharge, no eye  redness , no  intolerance to light   Respiratory: No wheezing , no  difficulty breathing. No cough , no mucus production  Cardiovascular: No CP, no leg swelling , no  Palpitations  GI: no nausea, no vomiting, no diarrhea , no  abdominal pain.  No blood in the stools. No dysphagia, no odynophagia    Endocrine: No polyphagia, no polyuria , no polydipsia  GU: No dysuria, gross hematuria, difficulty urinating.  C/o  urinary incontinence, chronic, slightly more noticeable in the last few months.  Musculoskeletal: Had back pain, saw a chiropractor, currently doing better.  Skin: No change in the color of the skin, palor , no  Rash. Has a skin lesion at the right shoulder  Allergic, immunologic: No environmental allergies , no  food allergies  Neurological: No dizziness no  syncope. No headaches. No diplopia, no slurred, no slurred speech, no motor deficits, no facial  Numbness  Hematological: No enlarged lymph nodes, no easy bruising , no unusual bleedings  Psychiatry: No suicidal ideas, no hallucinations, no beavior problems, no confusion.  No depression but has on and off anxiety, previously took medication.    Past Medical History  Diagnosis Date  . Nephrolithiasis 1999    rt kidney functions @ 40%, sees urology routinely (Dr.Peterson), kidney stones, recurrent  . Abortion history     x1  . Miscarriage       x1   . Abnormal vaginal Pap smear   . Anxiety   . BRCA1 negative 11/12/10    NEGATIVE MUTATION  . BRCA2 negative 11/12/10    NEGATIVE MUTATION  . Herpes simplex     HSV  . Nephrolithiasis   . Chronic cystitis   . Hypertension   . Tobacco abuse     Past Surgical History  Procedure Laterality Date  . Tonsillectomy    . Tubal ligation    . Cesarean section      X 2 W BTSP  . Endometrial ablation    . Laparoscopic total hysterectomy  6.21.2012    [facility]    Social History   Social History  . Marital Status: Married    Spouse Name: N/A  . Number of Children: 2  . Years of Education: N/A   Occupational History  . not working at present     Social History Main Topics  . Smoking status: Current Every Day Smoker -- 1.00 packs/day    Types: Cigarettes  . Smokeless tobacco: Never Used     Comment: quit on-off, currently 1 PPD   . Alcohol Use: 0.0 oz/week    0 Standard drinks or equivalent per week     Comment: rarely  . Drug Use: No     Comment: no recent marijuana  . Sexual Activity: Not Currently   Other Topics Concern  . Not on file  Social History Narrative   Lost mom 2014   Lost father Sonia Side 3/11   Lives locally with husband and 2 sons. (84-21  y/o boys)              Family History  Problem Relation Age of Onset  . Diabetes Father   . Hypertension Father   . Heart attack Father     First MI @ 40 - total of 69, deceased.  . Diabetes Mother   . Breast cancer Mother     deceased.  . Hypertension Mother   . Heart attack Mother     First MI @ 36, CABG in early 80's.  . Colon cancer Neg Hx   . Uterine cancer Neg Hx   . Ovarian cancer Neg Hx   . Breast cancer Maternal Grandmother        Medication List       This list is accurate as of: 12/25/15 11:59 PM.  Always use your most recent med list.               aspirin 81 MG tablet  Take 81 mg by mouth daily.     clonazePAM 0.5 MG tablet  Commonly known as:  KLONOPIN  Take 1 tablet (0.5 mg  total) by mouth 2 (two) times daily as needed for anxiety.     fish oil-omega-3 fatty acids 1000 MG capsule  Take 2 g by mouth daily.     metoprolol succinate 50 MG 24 hr tablet  Commonly known as:  TOPROL-XL  Take 1 tablet (50 mg total) by mouth daily.     multivitamin capsule  Take 1 capsule by mouth daily.     ST JOHNS WORT PO  Take 2 tablets by mouth daily. Reported on 12/25/2015     valACYclovir 500 MG tablet  Commonly known as:  VALTREX  Take 1 tablet (500 mg total) by mouth daily as needed.           Objective:   Physical Exam  Skin:      BP 118/68 mmHg  Pulse 61  Temp(Src) 97.6 F (36.4 C) (Oral)  Ht '5\' 8"'  (1.727 m)  Wt 195 lb (88.451 kg)  BMI 29.66 kg/m2  SpO2 99%  General:   Well developed, well nourished . NAD.  Neck: No  thyromegaly  HEENT:  Normocephalic . Face symmetric, atraumatic Lungs:  CTA B Normal respiratory effort, no intercostal retractions, no accessory muscle use. Heart: RRR,  no murmur.  No pretibial edema bilaterally  Abdomen:  Not distended, soft, non-tender. No rebound or rigidity.   Skin: Exposed areas without rash. Not pale. Not jaundice Neurologic:  alert & oriented X3.  Speech normal, gait appropriate for age and unassisted Strength symmetric and appropriate for age.  Psych: Cognition and judgment appear intact.  Cooperative with normal attention span and concentration.  Behavior appropriate. Slightly anxious but not depressed appearing.    Assessment & Plan:   Assessment HTN Anxiety Tobacco abuse Chronic cystitis Kidney stones Abnormal Pap smear Herpes genital, valtrex prn BRCA 1-2 neg Hysterectomy  PLAN HTN: Well-controlled Anxiety: On and off symptoms, not daily,. He took clonazepam as needed with good results. We agreed to go back on clonazepam as needed only. I also encouraged counseling Urinary incontinence, history of chronic cystitis: Recommend to continue discussing issues with gynecology and  urology. Skin lesion: Refer to dermatology Tobacco abuse: Counseled RTC  6 months .

## 2015-12-25 NOTE — Assessment & Plan Note (Addendum)
Td 2009 Female care  Dr Lily PeerFernandez Due for a MMG, pt plans to schedule  Diet-exercise discussed Labs: CMP, FLP, CBC, TSH

## 2015-12-25 NOTE — Patient Instructions (Signed)
GO TO THE LAB : Get the blood work     GO TO THE FRONT DESK Schedule your next appointment for a   checkup in 6 months 

## 2015-12-26 DIAGNOSIS — Z09 Encounter for follow-up examination after completed treatment for conditions other than malignant neoplasm: Secondary | ICD-10-CM | POA: Insufficient documentation

## 2015-12-26 NOTE — Assessment & Plan Note (Signed)
HTN: Well-controlled Anxiety: On and off symptoms, not daily,. He took clonazepam as needed with good results. We agreed to go back on clonazepam as needed only. I also encouraged counseling Urinary incontinence, history of chronic cystitis: Recommend to continue discussing issues with gynecology and urology. Skin lesion: Refer to dermatology Tobacco abuse: Counseled RTC  6 months .

## 2015-12-27 ENCOUNTER — Other Ambulatory Visit (INDEPENDENT_AMBULATORY_CARE_PROVIDER_SITE_OTHER): Payer: Commercial Managed Care - PPO

## 2015-12-27 DIAGNOSIS — R739 Hyperglycemia, unspecified: Secondary | ICD-10-CM | POA: Diagnosis not present

## 2015-12-27 LAB — HEMOGLOBIN A1C: Hgb A1c MFr Bld: 5.9 % (ref 4.6–6.5)

## 2016-01-17 ENCOUNTER — Other Ambulatory Visit: Payer: Self-pay

## 2016-01-17 MED ORDER — METOPROLOL SUCCINATE ER 50 MG PO TB24: 50.0000 mg | ORAL_TABLET | Freq: Every day | ORAL | Status: DC

## 2016-05-28 ENCOUNTER — Ambulatory Visit (INDEPENDENT_AMBULATORY_CARE_PROVIDER_SITE_OTHER): Payer: Commercial Managed Care - PPO | Admitting: Internal Medicine

## 2016-05-28 ENCOUNTER — Encounter: Payer: Self-pay | Admitting: Internal Medicine

## 2016-05-28 VITALS — BP 124/68 | HR 79 | Temp 98.1°F | Resp 14 | Ht 68.0 in | Wt 186.2 lb

## 2016-05-28 DIAGNOSIS — R55 Syncope and collapse: Secondary | ICD-10-CM

## 2016-05-28 NOTE — Assessment & Plan Note (Signed)
Vaso-vagal episode without syncope. Suspect sx  as described above likely due to a vaso-vagal episodes, neurological exam is normal, no residual symptoms except for a very mild frontal headache. Other consideration is a vertigo episode. It would be extremely unlikely this represents something serious such a warning aneurysm bleed or an ICB. We discussed the possibility of ordering a  CT or MRI of the brain, she feels comfortable with waiting but she will call me immediately if she has any symptoms or if she simply would like to proceed with imaging. RTC  next month for six-month checkup as already recommended

## 2016-05-28 NOTE — Progress Notes (Signed)
Pre visit review using our clinic review tool, if applicable. No additional management support is needed unless otherwise documented below in the visit note. 

## 2016-05-28 NOTE — Progress Notes (Signed)
Subjective:    Patient ID: [name], female    DOB: July 24, 1971, 45 y.o.   MRN: [id number]  DOS:  05/28/2016 Type of visit - description : Acute Interval history: On 06/26/2016 at 11 PM she was sleeping on the couch at her living room, her husband woke her up, they went to the kitchen to do some cleaning and shortly after had a "wave of nausea  ", severe per patient, associated with severe dizziness like a spinning, she bent down, she fell, hit her face on a table. She started shaking but apparently no actual convulsions, she couldn't speak for 5 seconds. After that she told her husband she was "okay", no postictal state. No loss of consciousness. Then she  vomited twice and after she vomit she went back to completely normal and felt well. Denies EtOH, no bladder or bowel incontinence, no further episodes, she did develop a dull frontal headache, very mild per patient.   Review of Systems  Denies fever chills No chest pain or palpitations No blood in the stools No unusual stress, she has actually not taken clonazepam in few weeks No diplopia, slurred speech or motor deficits   Past Medical History:  Diagnosis Date  . Abnormal vaginal Pap smear   . Abortion history    x1  . Anxiety   . BRCA1 negative 11/12/10   NEGATIVE MUTATION  . BRCA2 negative 11/12/10   NEGATIVE MUTATION  . Chronic cystitis   . Herpes simplex    HSV  . Hypertension   . Miscarriage    x1   . Nephrolithiasis 1999   rt kidney functions @ 40%, sees urology routinely (Dr.Peterson), kidney stones, recurrent  . Nephrolithiasis   . Tobacco abuse     Past Surgical History:  Procedure Laterality Date  . CESAREAN SECTION     X 2 W BTSP  . ENDOMETRIAL ABLATION    . LAPAROSCOPIC TOTAL HYSTERECTOMY  6.21.2012   [facility]  . TONSILLECTOMY    . TUBAL LIGATION      Social History   Social History  . Marital status: Married    Spouse name: N/A  . Number of children: 2  . Years of education: N/A    Occupational History  . not working at present     Social History Main Topics  . Smoking status: Current Every Day Smoker    Packs/day: 1.00    Types: Cigarettes  . Smokeless tobacco: Never Used     Comment: quit on-off, currently 1 PPD   . Alcohol use 0.0 oz/week     Comment: rarely  . Drug use: No     Comment: no recent marijuana  . Sexual activity: Not Currently   Other Topics Concern  . Not on file   Social History Narrative   Lost mom 2014   Lost father Sonia Side 3/11   Lives locally with husband and 2 sons. (20-21  y/o boys)                 Medication List       Accurate as of 05/28/16  8:56 AM. Always use your most recent med list.          aspirin 81 MG tablet Take 81 mg by mouth daily.   clonazePAM 0.5 MG tablet Commonly known as:  KLONOPIN Take 1 tablet (0.5 mg total) by mouth 2 (two) times daily as needed for anxiety.   fish oil-omega-3 fatty acids 1000 MG capsule Take 2 g  by mouth daily.   metoprolol succinate 50 MG 24 hr tablet Commonly known as:  TOPROL-XL Take 1 tablet (50 mg total) by mouth daily.   multivitamin capsule Take 1 capsule by mouth daily.   ST JOHNS WORT PO Take 2 tablets by mouth daily. Reported on 12/25/2015   valACYclovir 500 MG tablet Commonly known as:  VALTREX Take 1 tablet (500 mg total) by mouth daily as needed.          Objective:   Physical Exam BP 124/68 (BP Location: Left Arm, Patient Position: Sitting, Cuff Size: Normal)   Pulse 79   Temp 98.1 F (36.7 C) (Oral)   Resp 14   Ht '5\' 8"'  (1.727 m)   Wt 186 lb 4 oz (84.5 kg)   SpO2 99%   BMI 28.32 kg/m  General:   Well developed, well nourished . NAD.  HEENT:  Normocephalic . Face symmetric, atraumatic Neck: Normal carotid pulses, full range of motion Lungs:  CTA B Normal respiratory effort, no intercostal retractions, no accessory muscle use. Heart: RRR,  no murmur.  No pretibial edema bilaterally  Skin: Not pale. Not jaundice Neurologic:   alert & oriented X3.  Speech normal, gait appropriate for age and unassisted. EOMI, pupils equal and reactive, motor and DTRs symmetric. Romberg absent Psych--  Cognition and judgment appear intact.  Cooperative with normal attention span and concentration.  Behavior appropriate. No anxious or depressed appearing.      Assessment & Plan:  Assessment HTN Anxiety Tobacco abuse Chronic cystitis Kidney stones Abnormal Pap smear Herpes genital, valtrex prn BRCA 1-2 neg Hysterectomy  PLAN Vaso-vagal episode without syncope. Suspect sx  as described above likely due to a vaso-vagal episodes, neurological exam is normal, no residual symptoms except for a very mild frontal headache. Other consideration is a vertigo episode. It would be extremely unlikely this represents something serious such a warning aneurysm bleed or an ICB. We discussed the possibility of ordering a  CT or MRI of the brain, she feels comfortable with waiting but she will call me immediately if she has any symptoms or if she simply would like to proceed with imaging. RTC  next month for six-month checkup as already recommended    Today, I spent more than 34    min with the patient: >50% of the time counseling regards why  I think she does not have a serious condition, trying to reassure her, answering a  number of questions.

## 2016-05-28 NOTE — Patient Instructions (Signed)
  GO TO THE FRONT DESK Schedule your next appointment for a  Check up next month  Drink plenty of fluids   Call if the headache is not getting better in few days or if worsen   call if symptoms resurface   Vaso vagal reaction, pre syncope

## 2016-06-21 ENCOUNTER — Ambulatory Visit (INDEPENDENT_AMBULATORY_CARE_PROVIDER_SITE_OTHER): Payer: Commercial Managed Care - PPO | Admitting: Internal Medicine

## 2016-06-21 ENCOUNTER — Encounter: Payer: Self-pay | Admitting: Internal Medicine

## 2016-06-21 VITALS — BP 132/74 | HR 61 | Temp 97.9°F | Resp 16 | Ht 68.0 in | Wt 187.2 lb

## 2016-06-21 DIAGNOSIS — Z114 Encounter for screening for human immunodeficiency virus [HIV]: Secondary | ICD-10-CM

## 2016-06-21 DIAGNOSIS — R739 Hyperglycemia, unspecified: Secondary | ICD-10-CM

## 2016-06-21 DIAGNOSIS — I10 Essential (primary) hypertension: Secondary | ICD-10-CM | POA: Diagnosis not present

## 2016-06-21 DIAGNOSIS — Z23 Encounter for immunization: Secondary | ICD-10-CM | POA: Diagnosis not present

## 2016-06-21 LAB — HEMOGLOBIN A1C: HEMOGLOBIN A1C: 5.6 % (ref 4.6–6.5)

## 2016-06-21 LAB — HIV ANTIBODY (ROUTINE TESTING W REFLEX): HIV 1&2 Ab, 4th Generation: NONREACTIVE

## 2016-06-21 NOTE — Progress Notes (Signed)
Subjective:    Patient ID: [name], female    DOB: 30-Sep-1970, 45 y.o.   MRN: [id number]  DOS:  06/21/2016 Type of visit - description : rov Interval history: Since the last visit, she is doing well. No further syncope. No ambulatory BPs. She is doing better with exercise, is walking most days around 5 miles   Review of Systems No chest pain, difficulty breathing or palpitations No headaches  Past Medical History:  Diagnosis Date  . Abnormal vaginal Pap smear   . Abortion history    x1  . Anxiety   . BRCA1 negative 11/12/10   NEGATIVE MUTATION  . BRCA2 negative 11/12/10   NEGATIVE MUTATION  . Chronic cystitis   . Herpes simplex    HSV  . Hypertension   . Miscarriage    x1   . Nephrolithiasis 1999   rt kidney functions @ 40%, sees urology routinely (Dr.Peterson), kidney stones, recurrent  . Tobacco abuse     Past Surgical History:  Procedure Laterality Date  . CESAREAN SECTION     X 2 W BTSP  . ENDOMETRIAL ABLATION    . LAPAROSCOPIC TOTAL HYSTERECTOMY  6.21.2012   [facility]  . TONSILLECTOMY    . TUBAL LIGATION      Social History   Social History  . Marital status: Married    Spouse name: N/A  . Number of children: 2  . Years of education: N/A   Occupational History  . not working at present     Social History Main Topics  . Smoking status: Current Every Day Smoker    Packs/day: 1.00    Types: Cigarettes  . Smokeless tobacco: Never Used     Comment: quit on-off, currently 1 PPD   . Alcohol use 0.0 oz/week     Comment: rarely  . Drug use: No     Comment: no recent marijuana  . Sexual activity: Not Currently   Other Topics Concern  . Not on file   Social History Narrative   Lost mom 2014   Lost father Sonia Side 3/11   Lives locally with husband and 2 sons. (20-21  y/o boys)                 Medication List       Accurate as of 06/21/16  9:00 AM. Always use your most recent med list.          aspirin 81 MG tablet Take 81 mg by  mouth daily.   clonazePAM 0.5 MG tablet Commonly known as:  KLONOPIN Take 1 tablet (0.5 mg total) by mouth 2 (two) times daily as needed for anxiety.   fish oil-omega-3 fatty acids 1000 MG capsule Take 2 g by mouth daily.   metoprolol succinate 50 MG 24 hr tablet Commonly known as:  TOPROL-XL Take 1 tablet (50 mg total) by mouth daily.   multivitamin capsule Take 1 capsule by mouth daily.   ST JOHNS WORT PO Take 2 tablets by mouth daily. Reported on 12/25/2015   valACYclovir 500 MG tablet Commonly known as:  VALTREX Take 1 tablet (500 mg total) by mouth daily as needed.          Objective:   Physical Exam BP 132/74 (BP Location: Left Arm, Patient Position: Sitting, Cuff Size: Normal)   Pulse 61   Temp 97.9 F (36.6 C) (Oral)   Resp 16   Ht '5\' 8"'  (1.727 m)   Wt 187 lb 3.2 oz (84.9 kg)  SpO2 99% Comment: RA  BMI 28.46 kg/m  General:   Well developed, well nourished . NAD.  HEENT:  Normocephalic . Face symmetric, atraumatic Skin: 1 cm, round, slt hyperpigmented, homogeneous, skin lesion@ right shoulder. Neurologic:  alert & oriented X3.  Speech normal, gait appropriate for age and unassisted Psych--  Cognition and judgment appear intact.  Cooperative with normal attention span and concentration.  Behavior appropriate. No anxious or depressed appearing.      Assessment & Plan:   Assessment Hyperglycemia A1c 5.9 HTN Anxiety Tobacco abuse Chronic cystitis Kidney stones Hysterectomy, h/o abnormal PAPs   Herpes genital, valtrex prn BRCA 1-2 neg   PLAN Vaso-vagal episode -- no further episodes Hyperglycemia: Discussed with patient what is the meaning of a A1c, appropriate diet, exercise. Recheck a A1c HTN: Controlled. Skin lesion, right shoulder: Seems stable, she decided not to see dermatology. We agreed on observation Primary care: Flu shot, HIV screening RTC 6 months  Today, I spent more than  18  min with the patient: >50% of the time counseling  regards what is a A1c, how to eat properly in the context of prediabetes, exercise, etc.

## 2016-06-21 NOTE — Progress Notes (Signed)
Pre visit review using our clinic review tool, if applicable. No additional management support is needed unless otherwise documented below in the visit note. 

## 2016-06-21 NOTE — Assessment & Plan Note (Signed)
Vaso-vagal episode -- no further episodes Hyperglycemia: Discussed with patient what is the meaning of a A1c, appropriate diet, exercise. Recheck a A1c HTN: Controlled. Skin lesion, right shoulder: Seems stable, she decided not to see dermatology. We agreed on observation Primary care: Flu shot, HIV screening RTC 6 months

## 2016-06-21 NOTE — Patient Instructions (Signed)
GO TO THE LAB : Get the blood work     GO TO THE FRONT DESK Schedule your next appointment for a  Physical exam by 5 -2018     If you need more information about a healthy diet visit: The American Heart Association, http://www.heart.org  The American diabetes Association  Http://www.diabetes.org  The  [facility] website it is a great resource http://www.[facility].org

## 2016-07-21 ENCOUNTER — Other Ambulatory Visit: Payer: Self-pay | Admitting: Internal Medicine

## 2016-10-03 ENCOUNTER — Ambulatory Visit (HOSPITAL_BASED_OUTPATIENT_CLINIC_OR_DEPARTMENT_OTHER)
Admission: RE | Admit: 2016-10-03 | Discharge: 2016-10-03 | Disposition: A | Discharge status: Home / Self Care | Payer: Commercial Managed Care - PPO | Source: Ambulatory Visit | Attending: Internal Medicine | Admitting: Internal Medicine

## 2016-10-03 ENCOUNTER — Encounter: Payer: Self-pay | Admitting: Internal Medicine

## 2016-10-03 ENCOUNTER — Ambulatory Visit (INDEPENDENT_AMBULATORY_CARE_PROVIDER_SITE_OTHER): Payer: Commercial Managed Care - PPO | Admitting: Internal Medicine

## 2016-10-03 VITALS — BP 124/78 | HR 75 | Temp 97.8°F | Resp 14 | Ht 68.0 in | Wt 184.5 lb

## 2016-10-03 DIAGNOSIS — B349 Viral infection, unspecified: Secondary | ICD-10-CM | POA: Diagnosis not present

## 2016-10-03 DIAGNOSIS — J984 Other disorders of lung: Secondary | ICD-10-CM | POA: Diagnosis not present

## 2016-10-03 DIAGNOSIS — J019 Acute sinusitis, unspecified: Secondary | ICD-10-CM | POA: Diagnosis not present

## 2016-10-03 DIAGNOSIS — R05 Cough: Secondary | ICD-10-CM | POA: Diagnosis not present

## 2016-10-03 MED ORDER — AMOXICILLIN-POT CLAVULANATE 875-125 MG PO TABS: 1.0000 | ORAL_TABLET | Freq: Two times a day (BID) | ORAL | 0 refills | Status: DC

## 2016-10-03 MED ORDER — PREDNISONE 10 MG PO TABS: ORAL_TABLET | ORAL | 0 refills | Status: DC

## 2016-10-03 MED ORDER — BENZONATATE 200 MG PO CAPS: 200.0000 mg | ORAL_CAPSULE | Freq: Three times a day (TID) | ORAL | 0 refills | Status: DC | PRN

## 2016-10-03 NOTE — Progress Notes (Signed)
Subjective:    Patient ID: [name], female    DOB: 20-Oct-1970, 46 y.o.   MRN: [id number]  DOS:  10/03/2016 Type of visit - description :  Acute visit Interval history: Developed symptoms of the flu a week ago: Fever, chills, achiness, nausea, vomiting, diarrhea. 3 days later she felt better. 3 ago she started to feel bad again, this time with right ear pain and  severe sinus congestion. Unable to blow any mucus from the nose. No ear discharge.   Review of Systems No further fevers + Cough with occasional greenish sputum production. No chest pain or difficulty breathing.    Past Medical History:  Diagnosis Date  . Abnormal vaginal Pap smear   . Abortion history    x1  . Anxiety   . BRCA1 negative 11/12/10   NEGATIVE MUTATION  . BRCA2 negative 11/12/10   NEGATIVE MUTATION  . Chronic cystitis   . Herpes simplex    HSV  . Hypertension   . Miscarriage    x1   . Nephrolithiasis 1999   rt kidney functions @ 40%, sees urology routinely (Dr.Peterson), kidney stones, recurrent  . Tobacco abuse     Past Surgical History:  Procedure Laterality Date  . CESAREAN SECTION     X 2 W BTSP  . ENDOMETRIAL ABLATION    . LAPAROSCOPIC TOTAL HYSTERECTOMY  6.21.2012   TLH  . TONSILLECTOMY    . TUBAL LIGATION      Social History   Social History  . Marital status: Married    Spouse name: N/A  . Number of children: 2  . Years of education: N/A   Occupational History  . not working at present     Social History Main Topics  . Smoking status: Current Every Day Smoker    Packs/day: 1.00    Types: Cigarettes  . Smokeless tobacco: Never Used     Comment: quit on-off, currently 1 PPD   . Alcohol use 0.0 oz/week     Comment: rarely  . Drug use: No     Comment: no recent marijuana  . Sexual activity: Not Currently   Other Topics Concern  . Not on file   Social History Narrative   Lost mom 2014   Lost father Sonia Side 3/11   Lives locally with husband and 2 sons.  (20-21  y/o boys)               Allergies as of 10/03/2016      Reactions   Ciprofloxacin Anaphylaxis, Nausea And Vomiting   Codeine Anaphylaxis, Nausea And Vomiting   Demerol Anaphylaxis   Hydrocodone Anaphylaxis, Hives   Levofloxacin Anaphylaxis   Nortriptyline Hcl Anaphylaxis   REACTION: irritability--"anger"   Ultram [tramadol Hcl] Anaphylaxis   Oxycodone-acetaminophen Itching      Medication List       Accurate as of 10/03/16  7:08 PM. Always use your most recent med list.          amoxicillin-clavulanate 875-125 MG tablet Commonly known as:  AUGMENTIN Take 1 tablet by mouth 2 (two) times daily.   aspirin 81 MG tablet Take 81 mg by mouth daily.   benzonatate 200 MG capsule Commonly known as:  TESSALON Take 1 capsule (200 mg total) by mouth 3 (three) times daily as needed for cough.   clonazePAM 0.5 MG tablet Commonly known as:  KLONOPIN Take 1 tablet (0.5 mg total) by mouth 2 (two) times daily as needed for anxiety.   fish  oil-omega-3 fatty acids 1000 MG capsule Take 2 g by mouth daily.   metoprolol succinate 50 MG 24 hr tablet Commonly known as:  TOPROL-XL Take 1 tablet (50 mg total) by mouth daily.   multivitamin capsule Take 1 capsule by mouth daily.   predniSONE 10 MG tablet Commonly known as:  DELTASONE 3 tabs x 2 days, 2 tabs x 2 days, 1 tab x 2 days   ST JOHNS WORT PO Take 2 tablets by mouth daily. Reported on 12/25/2015   valACYclovir 500 MG tablet Commonly known as:  VALTREX Take 1 tablet (500 mg total) by mouth daily as needed.          Objective:   Physical Exam BP 124/78 (BP Location: Left Arm, Patient Position: Sitting, Cuff Size: Normal)   Pulse 75   Temp 97.8 F (36.6 C) (Oral)   Resp 14   Ht '5\' 8"'  (1.727 m)   Wt 184 lb 8 oz (83.7 kg)   SpO2 97%   BMI 28.05 kg/m  General:   Well developed, well nourished . NAD.  HEENT:  Normocephalic . Face symmetric, atraumatic. EOMI, pupils equal and reactive. Left TM: Slightly  bulge. Right TM: Bulge, + pus behind the membrane. Mastoids: Slightly tender on the right? Inspection is normal. Sinuses: Definitely TTP at the right maxillary area Lungs:  Rhonchi with cough, no crackles. Normal respiratory effort, no intercostal retractions, no accessory muscle use. Heart: RRR,  no murmur.  No pretibial edema bilaterally  Skin: Not pale. Not jaundice Neurologic:  alert & oriented X3.  Speech normal, gait appropriate for age and unassisted Psych--  Cognition and judgment appear intact.  Cooperative with normal attention span and concentration.  Behavior appropriate. No anxious or depressed appearing.      Assessment & Plan:   Assessment Hyperglycemia A1c 5.9 HTN Anxiety Tobacco abuse Chronic cystitis Kidney stones Hysterectomy, h/o abnormal PAPs   Herpes genital, valtrex prn BRCA 1-2 neg   PLAN Viral  Syndrome-- resolving Sinusitis, right ear infection: pt has s/s sinusitis and ear infection, likely a bacterial complication from the recent viral syndrome.she is very congested in the chest Plan: Augmentin, Mucinex, Tessalon, low dose prednisone for a few days, definitely call if not better. We'll get a chest x-ray to be sure.

## 2016-10-03 NOTE — Assessment & Plan Note (Signed)
Viral  Syndrome-- resolving Sinusitis, right ear infection: pt has s/s sinusitis and ear infection, likely a bacterial complication from the recent viral syndrome.she is very congested in the chest Plan: Augmentin, Mucinex, Tessalon, low dose prednisone for a few days, definitely call if not better. We'll get a chest x-ray to be sure.

## 2016-10-03 NOTE — Patient Instructions (Signed)
Get a chest x-ray is downstairs   Rest, fluids , tylenol  For cough:  Take Mucinex DM twice a day as needed until better You can also use Tessalon Perles as needed  For nasal congestion: Use OTC  Flonase : 2 nasal sprays on each side of the nose in the morning until you feel better    Take the antibiotic as prescribed  (Augmentin)  Call if not gradually better over the next  10 days  Definitely call if you are not gradually improving in the next few days. You have severe sinusitis and ear infection.

## 2016-10-03 NOTE — Progress Notes (Signed)
Pre visit review using our clinic review tool, if applicable. No additional management support is needed unless otherwise documented below in the visit note. 

## 2016-10-04 ENCOUNTER — Telehealth: Payer: Self-pay | Admitting: Internal Medicine

## 2016-10-04 NOTE — Telephone Encounter (Signed)
Pt called in she said that she she seen Dr. Drue NovelPaz yesterday and was prescribed antibiotics. Pt says that medication has been causing nausea. She would like to be advised on what she should do further?     CB: [phone]

## 2016-10-04 NOTE — Telephone Encounter (Signed)
Please advise in PCP absence. Thank you.  

## 2016-10-04 NOTE — Telephone Encounter (Signed)
She needs to make sure she is taking it with food and 12 h apart If she is doing that I can either change the abx or add med for nausea

## 2016-10-07 NOTE — Telephone Encounter (Signed)
Spoke w/ Pt, informed of recommendations. She denies having any outer ear swelling, appt scheduled w/ PCP at 1100 tomorrow.

## 2016-10-07 NOTE — Telephone Encounter (Signed)
Pt states her ear is still hurting really bad, states she only has the nausea and vomitting when she takes medication, states she was taking the meds properly and still no better, please advise

## 2016-10-07 NOTE — Telephone Encounter (Signed)
If she has any visible swelling around the ear needs to be seen today ([facility]?), otherwise I can see her tomorrow for a recheck. If the pain is that severe, may need to see ENT for drainage of the ear.

## 2016-10-07 NOTE — Telephone Encounter (Signed)
Spoke w/ Pt, she informed that she actually believes that the Prednisone is causing her nausea and vomiting, she has since stopped the prednisone and n/v has stopped. She reports that her ear is still bothering her and hasn't noticed an improvement since OV, she states that it may actually be worse. Informed I would let PCP know and call back with recommendations. Pt very upset that she did not receive a call back on Friday, I apologized and informed that PCP was out of office and that I had left early.

## 2016-10-07 NOTE — Telephone Encounter (Signed)
Recommend zofran  4 mg 1 tablet 3 times a day as needed for nausea #21 no refills. To take Zofran 30 minutes before Augmentin. If that is not working, will switch to a Z-Pak. Let us know. If she has severe symptoms needs to be seen again at the office.

## 2016-10-07 NOTE — Telephone Encounter (Signed)
Please advise was not here Friday afternoon and no one followed up w/ Pt. Thank you.

## 2016-10-08 ENCOUNTER — Encounter: Payer: Self-pay | Admitting: Internal Medicine

## 2016-10-08 ENCOUNTER — Ambulatory Visit (INDEPENDENT_AMBULATORY_CARE_PROVIDER_SITE_OTHER): Payer: Commercial Managed Care - PPO | Admitting: Internal Medicine

## 2016-10-08 VITALS — BP 124/80 | HR 78 | Temp 97.3°F | Resp 14 | Ht 68.0 in | Wt 184.0 lb

## 2016-10-08 DIAGNOSIS — H6690 Otitis media, unspecified, unspecified ear: Secondary | ICD-10-CM | POA: Diagnosis not present

## 2016-10-08 DIAGNOSIS — J019 Acute sinusitis, unspecified: Secondary | ICD-10-CM | POA: Diagnosis not present

## 2016-10-08 MED ORDER — AMOXICILLIN-POT CLAVULANATE 875-125 MG PO TABS: 1.0000 | ORAL_TABLET | Freq: Two times a day (BID) | ORAL | 0 refills | Status: DC

## 2016-10-08 NOTE — Progress Notes (Signed)
Pre visit review using our clinic review tool, if applicable. No additional management support is needed unless otherwise documented below in the visit note. 

## 2016-10-08 NOTE — Patient Instructions (Signed)
Finish the antibiotic  Continue Flonase 2 sprays on each side daily. You can continue with Flonase for many months  Call if not back to normal in a couple weeks ,  ENT referral ?

## 2016-10-08 NOTE — Progress Notes (Signed)
Subjective:    Patient ID: [name], female    DOB: [birth year], 46 y.o.   MRN: [id number]  DOS:  10/08/2016 Type of visit - description : Acute visit Interval history: Was diagnosed with sinusitis and a ear infection, was prescribed Augmentin, prednisone and Flonase. Develop nausea and vomiting shortly after prednisone so she stopped taking it. In the process she lost several tablets of Augmentin. Overall feels better, but still has right ear discomfort and some decreased hearing.  Review of Systems  No further fever chills Sinus much less congested and not as painful Nausea and vomiting completely resolved. Denies any ear discharge  Past Medical History:  Diagnosis Date  . Abnormal vaginal Pap smear   . Abortion history    x1  . Anxiety   . BRCA1 negative 11/12/10   NEGATIVE MUTATION  . BRCA2 negative 11/12/10   NEGATIVE MUTATION  . Chronic cystitis   . Herpes simplex    HSV  . Hypertension   . Miscarriage    x1   . Nephrolithiasis 1999   rt kidney functions @ 40%, sees urology routinely (Dr.Peterson), kidney stones, recurrent  . Tobacco abuse     Past Surgical History:  Procedure Laterality Date  . CESAREAN SECTION     X 2 W BTSP  . ENDOMETRIAL ABLATION    . LAPAROSCOPIC TOTAL HYSTERECTOMY  6.21.2012   TLH  . TONSILLECTOMY    . TUBAL LIGATION      Social History   Social History  . Marital status: Married    Spouse name: N/A  . Number of children: 2  . Years of education: N/A   Occupational History  . not working at present     Social History Main Topics  . Smoking status: Current Every Day Smoker    Packs/day: 1.00    Types: Cigarettes  . Smokeless tobacco: Never Used     Comment: quit on-off, currently 1 PPD   . Alcohol use 0.0 oz/week     Comment: rarely  . Drug use: No     Comment: no recent marijuana  . Sexual activity: Not Currently   Other Topics Concern  . Not on file   Social History Narrative   Lost mom 2014   Lost  father Sonia Side 3/11   Lives locally with husband and 2 sons. (20-21  y/o boys)               Allergies as of 10/08/2016      Reactions   Ciprofloxacin Anaphylaxis, Nausea And Vomiting   Codeine Anaphylaxis, Nausea And Vomiting   Demerol Anaphylaxis   Hydrocodone Anaphylaxis, Hives   Levofloxacin Anaphylaxis   Nortriptyline Hcl Anaphylaxis   REACTION: irritability--"anger"   Ultram [tramadol Hcl] Anaphylaxis   Oxycodone-acetaminophen Itching      Medication List       Accurate as of 10/08/16  6:24 PM. Always use your most recent med list.          amoxicillin-clavulanate 875-125 MG tablet Commonly known as:  AUGMENTIN Take 1 tablet by mouth 2 (two) times daily.   aspirin 81 MG tablet Take 81 mg by mouth daily.   benzonatate 200 MG capsule Commonly known as:  TESSALON Take 1 capsule (200 mg total) by mouth 3 (three) times daily as needed for cough.   clonazePAM 0.5 MG tablet Commonly known as:  KLONOPIN Take 1 tablet (0.5 mg total) by mouth 2 (two) times daily as needed for anxiety.   fish  oil-omega-3 fatty acids 1000 MG capsule Take 2 g by mouth daily.   metoprolol succinate 50 MG 24 hr tablet Commonly known as:  TOPROL-XL Take 1 tablet (50 mg total) by mouth daily.   multivitamin capsule Take 1 capsule by mouth daily.   ST JOHNS WORT PO Take 2 tablets by mouth daily. Reported on 12/25/2015   valACYclovir 500 MG tablet Commonly known as:  VALTREX Take 1 tablet (500 mg total) by mouth daily as needed.          Objective:   Physical Exam BP 124/80 (BP Location: Left Arm, Patient Position: Sitting, Cuff Size: Normal)   Pulse 78   Temp 97.3 F (36.3 C) (Oral)   Resp 14   Ht '5\' 8"'  (1.727 m)   Wt 184 lb (83.5 kg)   SpO2 98%   BMI 27.98 kg/m  General:   Well developed, well nourished . NAD.  HEENT:  Normocephalic . Face symmetric, atraumatic. No facial or periauricular swelling. Left TM normal Right TM: Moderately bulge, slightly red. Canal  normal, no ear discharge Sinuses: No TTP at this point. Lungs:  CTA B Normal respiratory effort, no intercostal retractions, no accessory muscle use. Heart: RRR,  no murmur.  No pretibial edema bilaterally  Skin: Not pale. Not jaundice Neurologic:  alert & oriented X3.  Speech normal, gait appropriate for age and unassisted Psych--  Cognition and judgment appear intact.  Cooperative with normal attention span and concentration.  Behavior appropriate. No anxious or depressed appearing.      Assessment & Plan:  Assessment Hyperglycemia A1c 5.9 HTN Anxiety Tobacco abuse Chronic cystitis Kidney stones Hysterectomy, h/o abnormal PAPs   Herpes genital, valtrex prn BRCA 1-2 neg   PLAN Sinusitis, right ear infection: She is definitely improving, right side w/ no pus behind the TM anymore, she still have some fluid. We agreed to continue Augmentin, Flonase, she was intolerant to prednisone (but no true allergy).  Additional Augmentin was Rx b/c she lost 6-8 tablets last week. To  call if not back to normal in a couple of weeks, consider ENT referral

## 2016-10-08 NOTE — Assessment & Plan Note (Signed)
Sinusitis, right ear infection: She is definitely improving, right side w/ no pus behind the TM anymore, she still have some fluid. We agreed to continue Augmentin, Flonase, she was intolerant to prednisone (but no true allergy).  Additional Augmentin was Rx b/c she lost 6-8 tablets last week. To  call if not back to normal in a couple of weeks, consider ENT referral

## 2016-10-17 ENCOUNTER — Telehealth: Payer: Self-pay | Admitting: Internal Medicine

## 2016-10-17 DIAGNOSIS — H9191 Unspecified hearing loss, right ear: Secondary | ICD-10-CM

## 2016-10-17 NOTE — Telephone Encounter (Signed)
[facility]

## 2016-10-17 NOTE — Telephone Encounter (Signed)
Candise BowensJen and/or Danford BadKristie, Pt was seen by PCP 10/03/16 and 10/08/2016 for R ear infection, she now reports decreased/no hearing in R ear. Can ENT see her either today or tomorrow? Thank you.

## 2016-10-17 NOTE — Telephone Encounter (Signed)
Arrange a ENT referral, DX ear infection, decreased hearing

## 2016-10-17 NOTE — Telephone Encounter (Signed)
Please advise 

## 2016-10-17 NOTE — Telephone Encounter (Signed)
Caller name: Lawanna Kobusngel  Relation to pt: self  Call back number: [phone] [facility]: [facility]   Reason for call: Pt states was seen in our office on 10-03-2016 and 10-08-2016 (acute visitt) for the same issue on her ear, pt mentioned still can not hear from right ear but she is not in pain, pt wants to know what to do since she finished all of her antibiotics and still has not recover hearing. Pt ask if she has to schedule another appt with provider or will she get referral to a specialist. Please advise.

## 2016-10-18 NOTE — Telephone Encounter (Signed)
Can you check status of referral when you have time please? Thank you.

## 2016-10-18 NOTE — Telephone Encounter (Signed)
Tried calling, keeps transferring to vm, left message, awaiting return call

## 2016-10-21 DIAGNOSIS — H9011 Conductive hearing loss, unilateral, right ear, with unrestricted hearing on the contralateral side: Secondary | ICD-10-CM | POA: Insufficient documentation

## 2016-10-21 DIAGNOSIS — H65191 Other acute nonsuppurative otitis media, right ear: Secondary | ICD-10-CM | POA: Diagnosis not present

## 2016-12-25 ENCOUNTER — Encounter: Payer: Self-pay | Admitting: Gynecology

## 2016-12-26 ENCOUNTER — Encounter: Payer: Self-pay | Admitting: Internal Medicine

## 2016-12-26 ENCOUNTER — Ambulatory Visit (INDEPENDENT_AMBULATORY_CARE_PROVIDER_SITE_OTHER): Payer: Commercial Managed Care - PPO | Admitting: Internal Medicine

## 2016-12-26 VITALS — BP 126/70 | HR 65 | Temp 98.3°F | Ht 68.0 in | Wt 184.2 lb

## 2016-12-26 DIAGNOSIS — E2839 Other primary ovarian failure: Secondary | ICD-10-CM

## 2016-12-26 DIAGNOSIS — R7309 Other abnormal glucose: Secondary | ICD-10-CM | POA: Diagnosis not present

## 2016-12-26 DIAGNOSIS — Z0000 Encounter for general adult medical examination without abnormal findings: Secondary | ICD-10-CM

## 2016-12-26 LAB — CBC WITH DIFFERENTIAL/PLATELET
BASOS PCT: 0.8 % (ref 0.0–3.0)
Basophils Absolute: 0.1 10*3/uL (ref 0.0–0.1)
EOS ABS: 0.1 10*3/uL (ref 0.0–0.7)
Eosinophils Relative: 1.6 % (ref 0.0–5.0)
HCT: 45.7 % (ref 36.0–46.0)
Hemoglobin: 15.7 g/dL — ABNORMAL HIGH (ref 12.0–15.0)
LYMPHS ABS: 2.2 10*3/uL (ref 0.7–4.0)
Lymphocytes Relative: 25.7 % (ref 12.0–46.0)
MCHC: 34.4 g/dL (ref 30.0–36.0)
MCV: 87.6 fl (ref 78.0–100.0)
MONO ABS: 0.4 10*3/uL (ref 0.1–1.0)
Monocytes Relative: 4.8 % (ref 3.0–12.0)
NEUTROS ABS: 5.8 10*3/uL (ref 1.4–7.7)
Neutrophils Relative %: 67.1 % (ref 43.0–77.0)
PLATELETS: 208 10*3/uL (ref 150.0–400.0)
RBC: 5.21 Mil/uL — ABNORMAL HIGH (ref 3.87–5.11)
RDW: 14.1 % (ref 11.5–15.5)
WBC: 8.7 10*3/uL (ref 4.0–10.5)

## 2016-12-26 LAB — COMPREHENSIVE METABOLIC PANEL
ALBUMIN: 4.6 g/dL (ref 3.5–5.2)
ALT: 15 U/L (ref 0–35)
AST: 16 U/L (ref 0–37)
Alkaline Phosphatase: 91 U/L (ref 39–117)
BUN: 12 mg/dL (ref 6–23)
CALCIUM: 9.8 mg/dL (ref 8.4–10.5)
CHLORIDE: 107 meq/L (ref 96–112)
CO2: 25 mEq/L (ref 19–32)
Creatinine, Ser: 0.88 mg/dL (ref 0.40–1.20)
GFR: 73.63 mL/min (ref >60.00)
Glucose, Bld: 105 mg/dL — ABNORMAL HIGH (ref 70–99)
POTASSIUM: 4.1 meq/L (ref 3.5–5.1)
SODIUM: 139 meq/L (ref 135–145)
Total Bilirubin: 0.4 mg/dL (ref 0.2–1.2)
Total Protein: 7.9 g/dL (ref 6.0–8.3)

## 2016-12-26 LAB — LIPID PANEL
CHOLESTEROL: 199 mg/dL (ref 0–200)
HDL: 34.9 mg/dL — AB (ref >39.00)
LDL CALC: 125 mg/dL — AB (ref 0–99)
NonHDL: 163.79
TRIGLYCERIDES: 194 mg/dL — AB (ref 0.0–149.0)
Total CHOL/HDL Ratio: 6
VLDL: 38.8 mg/dL (ref 0.0–40.0)

## 2016-12-26 LAB — VITAMIN D 25 HYDROXY (VIT D DEFICIENCY, FRACTURES): VITD: 19.99 ng/mL — ABNORMAL LOW (ref 30.00–100.00)

## 2016-12-26 LAB — HEMOGLOBIN A1C: Hgb A1c MFr Bld: 5.5 % (ref 4.6–6.5)

## 2016-12-26 MED ORDER — CLONAZEPAM 0.5 MG PO TABS: 0.5000 mg | ORAL_TABLET | Freq: Two times a day (BID) | ORAL | 2 refills | Status: DC | PRN

## 2016-12-26 NOTE — Assessment & Plan Note (Addendum)
--  Td 2009 --Female care: refer to gyn, used to see Dr Lily PeerFernandez --Due for a MMG, referral sent --Bone density test ordered, never had one, (+) surgical menopause , declined  HRT. Will also check vitamin D. --CCS: never had a csope , discuss at age 46 --Diet: Despite a very conscious effort to eat healthy she has not lost weight, recommend calorie counting and consider see the bariatric clinic. --Tobacco abuse: Counselor, not ready at this point ( she couldn't tolerate Wellbutrin in the past) Labs: CMP, FLP, CBC, A1c, vitamin D

## 2016-12-26 NOTE — Progress Notes (Signed)
Subjective:    Patient ID: [name], female    DOB: Nov 16, 1970, 46 y.o.   MRN: [id number]  DOS:  12/26/2016 Type of visit - description : CPX Interval history: IN GENERAL DOING WELL   Wt Readings from Last 3 Encounters:  12/26/16 184 lb 3.2 oz (83.6 kg)  10/08/16 184 lb (83.5 kg)  10/03/16 184 lb 8 oz (83.7 kg)     Review of Systems Reports some stress lately, to start a new job, some family issues. She is managing well with clonazepam as needed   Other than above, a 14 point review of systems is negative     Past Medical History:  Diagnosis Date  . Abnormal vaginal Pap smear   . Abortion history    x1  . Anxiety   . BRCA1 negative 11/12/10   NEGATIVE MUTATION  . BRCA2 negative 11/12/10   NEGATIVE MUTATION  . Chronic cystitis   . Herpes simplex    HSV  . Hypertension   . Miscarriage    x1   . Nephrolithiasis 1999   rt kidney functions @ 40%, sees urology routinely (Dr.Peterson), kidney stones, recurrent  . Tobacco abuse     Past Surgical History:  Procedure Laterality Date  . CESAREAN SECTION     X 2 W BTSP  . ENDOMETRIAL ABLATION    . LAPAROSCOPIC TOTAL HYSTERECTOMY  01/31/2011   TLH, B oophorectomy  . TONSILLECTOMY    . TUBAL LIGATION     Family History  Problem Relation Age of Onset  . Diabetes Father   . Hypertension Father   . Heart attack Father        First MI @ 55 - total of 22, deceased.  . Diabetes Mother   . Breast cancer Mother        deceased.  . Hypertension Mother   . Heart attack Mother        First MI @ 21, CABG in early 1's.  . Breast cancer Maternal Grandmother   . Colon cancer Neg Hx   . Uterine cancer Neg Hx   . Ovarian cancer Neg Hx     Social History   Social History  . Marital status: Married    Spouse name: N/A  . Number of children: 2  . Years of education: N/A   Occupational History  . [employer]    Social History Main Topics  . Smoking status: Current Every Day Smoker    Packs/day:  1.00    Types: Cigarettes  . Smokeless tobacco: Never Used     Comment: quit on-off, currently 1 PPD   . Alcohol use 0.0 oz/week     Comment: rarely  . Drug use: No     Comment: no recent marijuana  . Sexual activity: Not Currently   Other Topics Concern  . Not on file   Social History Narrative   Lost mom 2014   Lost father Sonia Side 3/11   Lives locally with husband and 2 sons. (1995, 1997)   Husband has a daughter (from before they married)               Allergies as of 12/26/2016      Reactions   Ciprofloxacin Anaphylaxis, Nausea And Vomiting   Codeine Anaphylaxis, Nausea And Vomiting   Demerol Anaphylaxis   Hydrocodone Anaphylaxis, Hives   Levofloxacin Anaphylaxis   Nortriptyline Hcl Anaphylaxis   REACTION: irritability--"anger"   Ultram [tramadol Hcl] Anaphylaxis   Oxycodone-acetaminophen Itching  Medication List       Accurate as of 12/26/16 11:59 PM. Always use your most recent med list.          aspirin 81 MG tablet Take 81 mg by mouth daily.   clonazePAM 0.5 MG tablet Commonly known as:  KLONOPIN Take 1 tablet (0.5 mg total) by mouth 2 (two) times daily as needed for anxiety.   fish oil-omega-3 fatty acids 1000 MG capsule Take 2 g by mouth daily.   metoprolol succinate 50 MG 24 hr tablet Commonly known as:  TOPROL-XL Take 1 tablet (50 mg total) by mouth daily.   multivitamin capsule Take 1 capsule by mouth daily.   ST JOHNS WORT PO Take 2 tablets by mouth daily. Reported on 12/25/2015   valACYclovir 500 MG tablet Commonly known as:  VALTREX Take 1 tablet (500 mg total) by mouth daily as needed.          Objective:   Physical Exam BP 126/70 (BP Location: Left Arm, Patient Position: Sitting, Cuff Size: Large)   Pulse 65   Temp 98.3 F (36.8 C) (Oral)   Ht _0  (1.727 m)   Wt 184 lb 3.2 oz (83.6 kg)   SpO2 98%   BMI 28.01 kg/m   General:   Well developed, well nourished . NAD.  Neck: No  thyromegaly  HEENT:  Normocephalic .  Face symmetric, atraumatic Lungs:  CTA B Normal respiratory effort, no intercostal retractions, no accessory muscle use. Heart: RRR,  no murmur.  No pretibial edema bilaterally  Abdomen:  Not distended, soft, non-tender. No rebound or rigidity.   Skin: Exposed areas without rash. Not pale. Not jaundice Neurologic:  alert & oriented X3.  Speech normal, gait appropriate for age and unassisted Strength symmetric and appropriate for age.  Psych: Cognition and judgment appear intact.  Cooperative with normal attention span and concentration.  Behavior appropriate. No anxious or depressed appearing.    Assessment & Plan:   Assessment Hyperglycemia A1c 5.9 HTN Anxiety Tobacco abuse-- tried Wellbutrin years ago "crazy thoughts" Chronic cystitis Kidney stones Hysterectomy, h/o abnormal PAPs   Herpes genital, valtrex prn BRCA 1-2 neg   PLAN Hyperglycemia: Check and labs HTN: On metoprolol, normal BPs Anxiety: Refill clonazepam, takes as needed RTC 6 months

## 2016-12-26 NOTE — Patient Instructions (Signed)
GO TO THE LAB : Get the blood work     GO TO THE FRONT DESK Schedule your next appointment for a  ROUTINE CHECK UP IN 6 MONTHS  Will refer you to have a bone density test, mammogram and to see the gynecologist . If you don't hear from those referrals please let us know

## 2016-12-27 NOTE — Assessment & Plan Note (Signed)
Hyperglycemia: Check and labs HTN: On metoprolol, normal BPs Anxiety: Refill clonazepam, takes as needed RTC 6 months

## 2016-12-30 MED ORDER — VITAMIN D (ERGOCALCIFEROL) 1.25 MG (50000 UNIT) PO CAPS: 50000.0000 [IU] | ORAL_CAPSULE | ORAL | 0 refills | Status: DC

## 2016-12-30 NOTE — Addendum Note (Signed)
Addended by: Conrad BurlingtonANTER, Doryce Mcgregory D on: 12/30/2016 08:00 AM   Modules accepted: Orders

## 2017-01-25 ENCOUNTER — Other Ambulatory Visit: Payer: Self-pay | Admitting: Internal Medicine

## 2017-05-15 ENCOUNTER — Encounter (HOSPITAL_BASED_OUTPATIENT_CLINIC_OR_DEPARTMENT_OTHER): Payer: Self-pay

## 2017-05-15 ENCOUNTER — Ambulatory Visit (HOSPITAL_BASED_OUTPATIENT_CLINIC_OR_DEPARTMENT_OTHER)
Admission: RE | Admit: 2017-05-15 | Discharge: 2017-05-15 | Disposition: A | Discharge status: Home / Self Care | Payer: 59 | Source: Ambulatory Visit | Attending: Internal Medicine | Admitting: Internal Medicine

## 2017-05-15 DIAGNOSIS — Z1231 Encounter for screening mammogram for malignant neoplasm of breast: Secondary | ICD-10-CM | POA: Insufficient documentation

## 2017-05-15 DIAGNOSIS — Z0000 Encounter for general adult medical examination without abnormal findings: Secondary | ICD-10-CM

## 2017-06-26 ENCOUNTER — Other Ambulatory Visit: Payer: Self-pay | Admitting: Internal Medicine

## 2017-06-27 NOTE — Telephone Encounter (Signed)
Rx faxed to [facility] pharmacy.  

## 2017-06-27 NOTE — Telephone Encounter (Signed)
Okay 30 and 3 refills 

## 2017-06-27 NOTE — Telephone Encounter (Signed)
Pt is requesting refill on clonazepam 0.5mg .  Last OV: 12/26/2016 Last Fill: 12/26/2016 #30 and 3RF UDS: None  NCCR printed; no issues noted  Please advise.

## 2017-06-27 NOTE — Telephone Encounter (Signed)
Rx printed, awaiting MD signature.  

## 2017-07-29 ENCOUNTER — Other Ambulatory Visit: Payer: Self-pay | Admitting: Internal Medicine

## 2017-09-26 ENCOUNTER — Other Ambulatory Visit: Payer: Self-pay | Admitting: Internal Medicine

## 2017-10-25 ENCOUNTER — Telehealth: Payer: Self-pay | Admitting: Internal Medicine

## 2017-10-27 ENCOUNTER — Telehealth: Payer: Self-pay | Admitting: Internal Medicine

## 2017-10-27 NOTE — Telephone Encounter (Unsigned)
Copied from CRM [phone]. Topic: Quick Communication - Rx Refill/Question >> Oct 27, 2017  5:32 PM Raquel SarnaHayes, Teresa G wrote: metoprolol succinate (TOPROL-XL) 50 MG 24 hr tablet  Pt is out of Rx  - Rx needs to filled ASAP  [facility] [place]- [place], [place] - [address] [place] [id number] Phone: [phone] Fax: [phone]

## 2017-10-27 NOTE — Telephone Encounter (Signed)
2 week supply of metoprolol sent to pharmacy. Pt is past due for follow up. Please call pt to schedule appt with Dr Drue NovelPaz soon. Thanks!

## 2017-10-28 NOTE — Telephone Encounter (Signed)
Called scheduled pt appt for 11/03/17 @8 :40am with Dr. Drue NovelPaz.

## 2017-11-03 ENCOUNTER — Ambulatory Visit: Payer: 59 | Admitting: Internal Medicine

## 2017-11-03 ENCOUNTER — Encounter: Payer: Self-pay | Admitting: Internal Medicine

## 2017-11-03 VITALS — BP 126/68 | HR 78 | Temp 98.0°F | Resp 14 | Ht 68.0 in | Wt 166.1 lb

## 2017-11-03 DIAGNOSIS — Z0000 Encounter for general adult medical examination without abnormal findings: Secondary | ICD-10-CM | POA: Diagnosis not present

## 2017-11-03 DIAGNOSIS — F419 Anxiety disorder, unspecified: Secondary | ICD-10-CM

## 2017-11-03 DIAGNOSIS — Z79899 Other long term (current) drug therapy: Secondary | ICD-10-CM | POA: Diagnosis not present

## 2017-11-03 DIAGNOSIS — E559 Vitamin D deficiency, unspecified: Secondary | ICD-10-CM | POA: Diagnosis not present

## 2017-11-03 DIAGNOSIS — E28319 Asymptomatic premature menopause: Secondary | ICD-10-CM

## 2017-11-03 LAB — CBC WITH DIFFERENTIAL/PLATELET
BASOS ABS: 0.1 10*3/uL (ref 0.0–0.1)
BASOS PCT: 0.7 % (ref 0.0–3.0)
Eosinophils Absolute: 0.2 10*3/uL (ref 0.0–0.7)
Eosinophils Relative: 2.3 % (ref 0.0–5.0)
HCT: 45.4 % (ref 36.0–46.0)
Hemoglobin: 15.6 g/dL — ABNORMAL HIGH (ref 12.0–15.0)
LYMPHS ABS: 2.4 10*3/uL (ref 0.7–4.0)
Lymphocytes Relative: 27.9 % (ref 12.0–46.0)
MCHC: 34.4 g/dL (ref 30.0–36.0)
MCV: 89.1 fl (ref 78.0–100.0)
Monocytes Absolute: 0.4 10*3/uL (ref 0.1–1.0)
Monocytes Relative: 4.3 % (ref 3.0–12.0)
NEUTROS ABS: 5.5 10*3/uL (ref 1.4–7.7)
NEUTROS PCT: 64.8 % (ref 43.0–77.0)
PLATELETS: 187 10*3/uL (ref 150.0–400.0)
RBC: 5.09 Mil/uL (ref 3.87–5.11)
RDW: 13.7 % (ref 11.5–15.5)
WBC: 8.5 10*3/uL (ref 4.0–10.5)

## 2017-11-03 LAB — LIPID PANEL
CHOL/HDL RATIO: 5
CHOLESTEROL: 185 mg/dL (ref 0–200)
HDL: 39.5 mg/dL (ref >39.00)
LDL CALC: 129 mg/dL — AB (ref 0–99)
NonHDL: 145.93
TRIGLYCERIDES: 86 mg/dL (ref 0.0–149.0)
VLDL: 17.2 mg/dL (ref 0.0–40.0)

## 2017-11-03 LAB — COMPREHENSIVE METABOLIC PANEL
ALT: 11 U/L (ref 0–35)
AST: 14 U/L (ref 0–37)
Albumin: 4.1 g/dL (ref 3.5–5.2)
Alkaline Phosphatase: 71 U/L (ref 39–117)
BUN: 13 mg/dL (ref 6–23)
CALCIUM: 9.8 mg/dL (ref 8.4–10.5)
CHLORIDE: 107 meq/L (ref 96–112)
CO2: 23 meq/L (ref 19–32)
Creatinine, Ser: 0.72 mg/dL (ref 0.40–1.20)
GFR: 92.47 mL/min (ref >60.00)
Glucose, Bld: 110 mg/dL — ABNORMAL HIGH (ref 70–99)
Potassium: 4 mEq/L (ref 3.5–5.1)
Sodium: 138 mEq/L (ref 135–145)
Total Bilirubin: 0.4 mg/dL (ref 0.2–1.2)
Total Protein: 7.8 g/dL (ref 6.0–8.3)

## 2017-11-03 LAB — TSH: TSH: 1.87 u[IU]/mL (ref 0.35–4.50)

## 2017-11-03 MED ORDER — VALACYCLOVIR HCL 500 MG PO TABS: 500.0000 mg | ORAL_TABLET | Freq: Every day | ORAL | 6 refills | Status: DC | PRN

## 2017-11-03 NOTE — Assessment & Plan Note (Signed)
Hyperglycemia: Last A1c satisfactory HTN: Well-controlled on metoprolol Anxiety depression: klonopin works well, still has stressful days, mostly related to the mental health of her children.  Counseled.  She is not inclined to take any medication other than clonazepam.  Counselors information provided.  UDS and contract today Genital herpes: Refill Valtrex, as needed Vitamin D deficiency: Did not like ergocalciferol, had vague GI s/e thus Rec OTC vitamin D 5000 units 3 times a week RTC 1 year

## 2017-11-03 NOTE — Progress Notes (Signed)
Subjective:    Patient ID: [name], female    DOB: [birth year], 47 y.o.   MRN: [id number]  DOS:  11/03/2017 Type of visit - description : cpx Interval history: No major concerns  Wt Readings from Last 3 Encounters:  11/03/17 166 lb 2 oz (75.4 kg)  12/26/16 184 lb 3.2 oz (83.6 kg)  10/08/16 184 lb (83.5 kg)    Review of Systems In general feeling well, have a full-time job, eating healthier, losing weight. Despite all that, she has times that she feels tearful d/t stress, mostly related to her children.  Other than above, a 14 point review of systems is negative     Past Medical History:  Diagnosis Date  . Abnormal vaginal Pap smear   . Abortion history    x1  . Anxiety   . BRCA1 negative 11/12/10   NEGATIVE MUTATION  . BRCA2 negative 11/12/10   NEGATIVE MUTATION  . Chronic cystitis   . Herpes simplex    HSV  . Hypertension   . Miscarriage    x1   . Nephrolithiasis 1999   rt kidney functions @ 40%, sees urology routinely (Dr.Peterson), kidney stones, recurrent  . Tobacco abuse     Past Surgical History:  Procedure Laterality Date  . CESAREAN SECTION     X 2 W BTSP  . ENDOMETRIAL ABLATION    . LAPAROSCOPIC TOTAL HYSTERECTOMY  01/31/2011   TLH, B oophorectomy  . TONSILLECTOMY    . TUBAL LIGATION      Social History   Socioeconomic History  . Marital status: Married    Spouse name: Not on file  . Number of children: 2  . Years of education: Not on file  . Highest education level: Not on file  Occupational History  . 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 Financial resource strain: Not on file  . Food insecurity:    Worry: Not on file    Inability: Not on file  . Transportation needs:    Medical: Not on file    Non-medical: Not on file  Tobacco Use  . Smoking status: Current Every Day Smoker    Packs/day: 1.00    Types: Cigarettes  . Smokeless tobacco: Never Used  . Tobacco comment: slt less than 1 ppd   Substance and  Sexual Activity  . Alcohol use: Yes    Alcohol/week: 0.0 oz    Comment: rarely  . Drug use: No    Comment: no recent marijuana  . Sexual activity: Not Currently  Lifestyle  . Physical activity:    Days per week: Not on file    Minutes per session: Not on file  . Stress: Not on file  Relationships  . Social connections:    Talks on phone: Not on file    Gets together: Not on file    Attends religious service: Not on file    Active member of club or organization: Not on file    Attends meetings of clubs or organizations: Not on file    Relationship status: Not on file  . Intimate partner violence:    Fear of current or ex partner: Not on file    Emotionally abused: Not on file    Physically abused: Not on file    Forced sexual activity: Not on file  Other Topics Concern  . Not on file  Social History Narrative   Lost mom 2014   Lost father Sonia Side 3/11   Lives  locally with husband and 2 sons. (1995, 1997)   Husband has a daughter (from before they married)           Family History  Problem Relation Age of Onset  . Diabetes Father   . Hypertension Father   . Heart attack Father        First MI @ 33 - total of 50, deceased.  . Diabetes Mother   . Breast cancer Mother        deceased.  . Hypertension Mother   . Heart attack Mother        First MI @ 72, CABG in early 75's.  . Breast cancer Maternal Grandmother   . Colon cancer Neg Hx   . Uterine cancer Neg Hx   . Ovarian cancer Neg Hx     Allergies as of 11/03/2017      Reactions   Ciprofloxacin Anaphylaxis, Nausea And Vomiting   Codeine Anaphylaxis, Nausea And Vomiting   Demerol Anaphylaxis   Hydrocodone Anaphylaxis, Hives   Levofloxacin Anaphylaxis   Nortriptyline Hcl Anaphylaxis   REACTION: irritability--"anger"   Ultram [tramadol Hcl] Anaphylaxis   Oxycodone-acetaminophen Itching   Prednisone Nausea And Vomiting      Medication List        Accurate as of 11/03/17  5:03 PM. Always use your most recent  med list.          aspirin 81 MG tablet Take 81 mg by mouth daily.   clonazePAM 0.5 MG tablet Commonly known as:  KLONOPIN Take 1 tablet (0.5 mg total) 2 (two) times daily as needed by mouth for anxiety.   fish oil-omega-3 fatty acids 1000 MG capsule Take 2 g by mouth daily.   metoprolol succinate 50 MG 24 hr tablet Commonly known as:  TOPROL-XL take 1 tablet by mouth once daily   multivitamin capsule Take 1 capsule by mouth daily.   valACYclovir 500 MG tablet Commonly known as:  VALTREX Take 1 tablet (500 mg total) by mouth daily as needed.          Objective:   Physical Exam BP 126/68 (BP Location: Left Arm, Patient Position: Sitting, Cuff Size: Normal)   Pulse 78   Temp 98 F (36.7 C) (Oral)   Resp 14   Ht '5\' 8"'  (1.727 m)   Wt 166 lb 2 oz (75.4 kg)   SpO2 98%   BMI 25.26 kg/m  General:   Well developed, well nourished . NAD.  Neck: No  thyromegaly  HEENT:  Normocephalic . Face symmetric, atraumatic Lungs:  CTA B Normal respiratory effort, no intercostal retractions, no accessory muscle use. Heart: RRR,  no murmur.  No pretibial edema bilaterally  Abdomen:  Not distended, soft, non-tender. No rebound or rigidity.   Skin: Exposed areas without rash. Not pale. Not jaundice Neurologic:  alert & oriented X3.  Speech normal, gait appropriate for age and unassisted Strength symmetric and appropriate for age.  Psych: Cognition and judgment appear intact.  Cooperative with normal attention span and concentration.  Behavior appropriate. At the end of the visit when we talk about her straits she become tearful    Assessment & Plan:   Assessment Hyperglycemia A1c 5.9 HTN Anxiety Tobacco abuse-- tried Wellbutrin years ago "crazy thoughts" Chronic cystitis Kidney stones Hysterectomy, h/o abnormal PAPs   Herpes genital, valtrex prn BRCA 1-2 neg Vit D def (dx 2018, did not tolerate well ergocalciferol)  PLAN Hyperglycemia: Last A1c satisfactory HTN:  Well-controlled on metoprolol Anxiety depression: klonopin works  well, still has stressful days, mostly related to the mental health of her children.  Counseled.  She is not inclined to take any medication other than clonazepam.  Counselors information provided.  UDS and contract today Genital herpes: Refill Valtrex, as needed Vitamin D deficiency: Did not like ergocalciferol, had vague GI s/e thus Rec OTC vitamin D 5000 units 3 times a week RTC 1 year

## 2017-11-03 NOTE — Patient Instructions (Signed)
GO TO THE LAB : Get the blood work     GO TO THE FRONT DESK Schedule your next appointment for a physical exam in 1 year   Vitamin D 5000 units: 3 times a week

## 2017-11-03 NOTE — Progress Notes (Signed)
Pre visit review using our clinic review tool, if applicable. No additional management support is needed unless otherwise documented below in the visit note. 

## 2017-11-03 NOTE — Assessment & Plan Note (Addendum)
--  Td 2009 --Female care:  rec to call gyn  -- MMG 05-2017 neg --Bone density test ordered, never had one, (+) surgical menopause: Rx dexa  --CCS: never had a csope , discuss at age 47 --Diet, exercise: Doing great, working, very active at work, diet has significantly improved.  + Weight loss --Tobacco abuse: Counseled, nicotine supplements?  Chantix? --Labs: CMP, FLP, CBC, TSH, vitamin D

## 2017-11-06 LAB — PAIN MGMT, PROFILE 8 W/CONF, U
6 ACETYLMORPHINE: NEGATIVE ng/mL (ref <10)
ALCOHOL METABOLITES: NEGATIVE ng/mL (ref <500)
ALPHAHYDROXYALPRAZOLAM: NEGATIVE ng/mL (ref <25)
AMINOCLONAZEPAM: 151 ng/mL — AB (ref <25)
Alphahydroxymidazolam: NEGATIVE ng/mL (ref <50)
Alphahydroxytriazolam: NEGATIVE ng/mL (ref <50)
Amphetamines: NEGATIVE ng/mL (ref <500)
BUPRENORPHINE, URINE: NEGATIVE ng/mL (ref <5)
Benzodiazepines: POSITIVE ng/mL — AB (ref <100)
COCAINE METABOLITE: NEGATIVE ng/mL (ref <150)
Creatinine: 160.7 mg/dL
HYDROXYETHYLFLURAZEPAM: NEGATIVE ng/mL (ref <50)
LORAZEPAM: NEGATIVE ng/mL (ref <50)
MARIJUANA METABOLITE: POSITIVE ng/mL — AB (ref <20)
MDMA: NEGATIVE ng/mL (ref <500)
Marijuana Metabolite: 513 ng/mL — ABNORMAL HIGH (ref <5)
Nordiazepam: NEGATIVE ng/mL (ref <50)
OPIATES: NEGATIVE ng/mL (ref <100)
OXAZEPAM: NEGATIVE ng/mL (ref <50)
Oxidant: NEGATIVE ug/mL (ref <200)
Oxycodone: NEGATIVE ng/mL (ref <100)
TEMAZEPAM: NEGATIVE ng/mL (ref <50)
pH: 6.62 (ref 4.5–9.0)

## 2017-11-06 LAB — VITAMIN D 1,25 DIHYDROXY
Vitamin D 1, 25 (OH)2 Total: 55 pg/mL (ref 18–72)
Vitamin D2 1, 25 (OH)2: <8 pg/mL
Vitamin D3 1, 25 (OH)2: 55 pg/mL

## 2017-11-10 ENCOUNTER — Other Ambulatory Visit: Payer: Self-pay | Admitting: Internal Medicine

## 2017-11-10 MED ORDER — METOPROLOL SUCCINATE ER 50 MG PO TB24: 50.0000 mg | ORAL_TABLET | Freq: Every day | ORAL | 3 refills | Status: DC

## 2017-11-10 NOTE — Telephone Encounter (Signed)
Copied from CRM [phone]. Topic: Quick Communication - Rx Refill/Question >> Nov 10, 2017  1:07 PM Oneal GroutSebastian, Jennifer S wrote: Medication: metoprolol succinate (TOPROL-XL) 50 MG 24 hr tablet  Has the patient contacted their pharmacy? Yes.   (Agent: If no, request that the patient contact the pharmacy for the refill.) Preferred Pharmacy (with phone number or street name): [facility] [place] Agent: Please be advised that RX refills may take up to 3 business days. We ask that you follow-up with your pharmacy.

## 2017-11-10 NOTE — Telephone Encounter (Signed)
Rx refill  Request Toprol XL   Last filled  10/27/2017   Stated  Needed  Ov  For  More  Refills     LOV 11/03/2017  Pharmacy  Requested [facility]

## 2017-12-14 DIAGNOSIS — N3000 Acute cystitis without hematuria: Secondary | ICD-10-CM | POA: Diagnosis not present

## 2017-12-14 DIAGNOSIS — N3091 Cystitis, unspecified with hematuria: Secondary | ICD-10-CM | POA: Diagnosis not present

## 2017-12-26 DIAGNOSIS — N3000 Acute cystitis without hematuria: Secondary | ICD-10-CM | POA: Diagnosis not present

## 2017-12-26 DIAGNOSIS — N3091 Cystitis, unspecified with hematuria: Secondary | ICD-10-CM | POA: Diagnosis not present

## 2018-09-02 DIAGNOSIS — N3001 Acute cystitis with hematuria: Secondary | ICD-10-CM | POA: Diagnosis not present

## 2018-09-02 DIAGNOSIS — N3091 Cystitis, unspecified with hematuria: Secondary | ICD-10-CM | POA: Diagnosis not present

## 2018-10-28 ENCOUNTER — Other Ambulatory Visit: Payer: Self-pay | Admitting: Internal Medicine

## 2018-12-04 ENCOUNTER — Telehealth: Payer: Self-pay

## 2018-12-04 NOTE — Telephone Encounter (Signed)
Spoke w/ Pt- offered cpe (Pt now has [employer]), made her aware of virtual visits if she needs [place]. Pt verbalized understanding and thanked me for the call.

## 2019-01-21 ENCOUNTER — Telehealth: Payer: Self-pay | Admitting: Internal Medicine

## 2019-02-04 MED ORDER — METOPROLOL SUCCINATE ER 50 MG PO TB24: 50.0000 mg | ORAL_TABLET | Freq: Every day | ORAL | 0 refills | Status: DC

## 2019-02-04 NOTE — Telephone Encounter (Signed)
Pt is now out of medication and was not advised by pharmacy to schedule appt appt until today. Pt is scheduled for doxy 7/1. Please send medication to:  [id number] [address], [place]. [phone] (Phone) [phone] (Fax)

## 2019-02-04 NOTE — Addendum Note (Signed)
Addended byDamita Dunnings D on: 02/04/2019 03:42 PM   Modules accepted: Orders

## 2019-02-04 NOTE — Telephone Encounter (Signed)
Rx sent 

## 2019-02-09 ENCOUNTER — Telehealth: Payer: Self-pay

## 2019-02-09 ENCOUNTER — Telehealth: Payer: Self-pay | Admitting: *Deleted

## 2019-02-09 DIAGNOSIS — Z20822 Contact with and (suspected) exposure to covid-19: Secondary | ICD-10-CM

## 2019-02-09 NOTE — Telephone Encounter (Signed)
Proceed w/ testing

## 2019-02-09 NOTE — Telephone Encounter (Signed)
Spoke w/ Pt- informed that she will be contacted to schedule.

## 2019-02-09 NOTE — Telephone Encounter (Signed)
Message sent to pool for testing.

## 2019-02-09 NOTE — Telephone Encounter (Signed)
Copied from [place] [id number]. Topic: General - Inquiry >> Feb 09, 2019  2:07 PM [place] D wrote: Reason for CRM: Pt stated that she has been exposed to individuals at her work place who were around an individual who tested positive for Covid-19. Pt would like to know if Dr. Larose Kells thinks she should be tested. Her spouse is 42 and she does not want to expose him. Please advise.

## 2019-02-09 NOTE — Telephone Encounter (Signed)
-----   Message from [place], [place] sent at 02/09/2019  3:11 PM EDT ----- Needs testing please.

## 2019-02-09 NOTE — Telephone Encounter (Signed)
Pt has virtual visit scheduled tomorrow for medication refills.

## 2019-02-09 NOTE — Telephone Encounter (Signed)
Referred for covid19 due to exposure. Dr. Larose Kells. Appointment made at the [facility] site for 02/10/19 at 11:15a. Informed to wear mask and stay in vehicle.

## 2019-02-10 ENCOUNTER — Other Ambulatory Visit: Payer: 59

## 2019-02-10 ENCOUNTER — Ambulatory Visit (INDEPENDENT_AMBULATORY_CARE_PROVIDER_SITE_OTHER): Payer: 59 | Admitting: Internal Medicine

## 2019-02-10 ENCOUNTER — Encounter: Payer: Self-pay | Admitting: Internal Medicine

## 2019-02-10 ENCOUNTER — Other Ambulatory Visit: Payer: Self-pay

## 2019-02-10 DIAGNOSIS — F411 Generalized anxiety disorder: Secondary | ICD-10-CM

## 2019-02-10 DIAGNOSIS — Z20822 Contact with and (suspected) exposure to covid-19: Secondary | ICD-10-CM

## 2019-02-10 DIAGNOSIS — F17200 Nicotine dependence, unspecified, uncomplicated: Secondary | ICD-10-CM | POA: Diagnosis not present

## 2019-02-10 DIAGNOSIS — Z01419 Encounter for gynecological examination (general) (routine) without abnormal findings: Secondary | ICD-10-CM

## 2019-02-10 DIAGNOSIS — I10 Essential (primary) hypertension: Secondary | ICD-10-CM | POA: Diagnosis not present

## 2019-02-10 DIAGNOSIS — Z1231 Encounter for screening mammogram for malignant neoplasm of breast: Secondary | ICD-10-CM

## 2019-02-10 NOTE — Progress Notes (Signed)
Subjective:    Patient ID: [name], female    DOB: 1971-05-03, 48 y.o.   MRN: [id number]  DOS:  02/10/2019 Type of visit - description: Virtual Visit via Video Note  I connected with@ on 02/10/19 at  3:00 PM EDT by a video enabled telemedicine application and verified that I am speaking with the correct person using two identifiers.   THIS ENCOUNTER IS A VIRTUAL VISIT DUE TO COVID-19 - PATIENT WAS NOT SEEN IN THE OFFICE. PATIENT HAS CONSENTED TO VIRTUAL VISIT / TELEMEDICINE VISIT   Location of patient: home  Location of provider: office  I discussed the limitations of evaluation and management by telemedicine and the availability of in person appointments. The patient expressed understanding and agreed to proceed.  History of Present Illness: Routine follow-up Since the last office visit she is doing well. She is eating healthier, has lost weight, does not need clonazepam. She has cut down on tobacco. Labs reviewed .   Review of Systems Denies fever chills No nausea, vomiting, diarrhea. No cough No chest pain no difficulty breathing + Weight loss, mostly due to being more active.  Past Medical History:  Diagnosis Date  . Abnormal vaginal Pap smear   . Abortion history    x1  . Anxiety   . BRCA1 negative 11/12/10   NEGATIVE MUTATION  . BRCA2 negative 11/12/10   NEGATIVE MUTATION  . Chronic cystitis   . Herpes simplex    HSV  . Hypertension   . Miscarriage    x1   . Nephrolithiasis 1999   rt kidney functions @ 40%, sees urology routinely (Dr.Peterson), kidney stones, recurrent  . Tobacco abuse     Past Surgical History:  Procedure Laterality Date  . CESAREAN SECTION     X 2 W BTSP  . ENDOMETRIAL ABLATION    . LAPAROSCOPIC TOTAL HYSTERECTOMY  01/31/2011   TLH, B oophorectomy  . TONSILLECTOMY    . TUBAL LIGATION      Social History   Socioeconomic History  . Marital status: Married    Spouse name: Not on file  . Number of children: 2  . Years  of education: Not on file  . Highest education level: Not on file  Occupational History  . 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 Financial resource strain: Not on file  . Food insecurity    Worry: Not on file    Inability: Not on file  . Transportation needs    Medical: Not on file    Non-medical: Not on file  Tobacco Use  . Smoking status: Current Every Day Smoker    Packs/day: 1.00    Types: Cigarettes  . Smokeless tobacco: Never Used  . Tobacco comment: slt less than 1 ppd   Substance and Sexual Activity  . Alcohol use: Yes    Alcohol/week: 0.0 standard drinks    Comment: rarely  . Drug use: No    Comment: no recent marijuana  . Sexual activity: Not Currently  Lifestyle  . Physical activity    Days per week: Not on file    Minutes per session: Not on file  . Stress: Not on file  Relationships  . Social Herbalist on phone: Not on file    Gets together: Not on file    Attends religious service: Not on file    Active member of club or organization: Not on file    Attends meetings of clubs or organizations:  Not on file    Relationship status: Not on file  . Intimate partner violence    Fear of current or ex partner: Not on file    Emotionally abused: Not on file    Physically abused: Not on file    Forced sexual activity: Not on file  Other Topics Concern  . Not on file  Social History Narrative   Lost mom 2014   Lost father Sonia Side 3/11   Lives locally with husband and 2 sons. (1995, 1997)   Husband has a daughter (from before they married)            Allergies as of 02/10/2019      Reactions   Ciprofloxacin Anaphylaxis, Nausea And Vomiting   Codeine Anaphylaxis, Nausea And Vomiting   Demerol Anaphylaxis   Hydrocodone Anaphylaxis, Hives   Levofloxacin Anaphylaxis   Nortriptyline Hcl Anaphylaxis   REACTION: irritability--"anger"   Ultram [tramadol Hcl] Anaphylaxis   Oxycodone-acetaminophen Itching   Prednisone Nausea And Vomiting       Medication List       Accurate as of February 10, 2019  2:55 PM. If you have any questions, ask your nurse or doctor.        aspirin 81 MG tablet Take 81 mg by mouth daily.   clonazePAM 0.5 MG tablet Commonly known as: KLONOPIN Take 1 tablet (0.5 mg total) 2 (two) times daily as needed by mouth for anxiety.   fish oil-omega-3 fatty acids 1000 MG capsule Take 2 g by mouth daily.   metoprolol succinate 50 MG 24 hr tablet Commonly known as: TOPROL-XL Take 1 tablet (50 mg total) by mouth daily. Take with or immediately following a meal.   multivitamin capsule Take 1 capsule by mouth daily.   valACYclovir 500 MG tablet Commonly known as: VALTREX Take 1 tablet (500 mg total) by mouth daily as needed.           Objective:   Physical Exam There were no vitals taken for this visit. This is our virtual video visit, alert oriented x3, no apparent distress    Assessment     Assessment Hyperglycemia A1c 5.9 HTN Anxiety Tobacco abuse-- tried Wellbutrin years ago "crazy thoughts" Chronic cystitis Kidney stones Hysterectomy, h/o abnormal PAPs   Herpes genital, valtrex prn BRCA 1-2 neg Vit D def (dx 2018, did not tolerate well ergocalciferol)  PLAN at  HTN: Currently on metoprolol.  No change. Anxiety: Currently on no medication, has not used clonazepam in long time.  Will leave it on the medication list and refill it if needed. Tobacco abuse: Has definitely cut down, less than half pack a day.  praised. COVID-19 exposure?  A test is pending, she is asymptomatic  Preventive care: Due to see a new gynecologist, referral sent Mammogram referral sent Recommend a flu shot this season CPX in 4 months, will arrange

## 2019-02-12 NOTE — Assessment & Plan Note (Signed)
HTN: Currently on metoprolol.  No change. Anxiety: Currently on no medication, has not used clonazepam in long time.  Will leave it on the medication list and refill it if needed. Tobacco abuse: Has definitely cut down, less than half pack a day.  praised. COVID-19 exposure?  A test is pending, she is asymptomatic  Preventive care: Due to see a new gynecologist, referral sent Mammogram referral sent Recommend a flu shot this season CPX in 4 months, will arrange

## 2019-02-15 LAB — NOVEL CORONAVIRUS, NAA: SARS-CoV-2, NAA: NOT DETECTED

## 2019-03-12 ENCOUNTER — Ambulatory Visit (HOSPITAL_BASED_OUTPATIENT_CLINIC_OR_DEPARTMENT_OTHER)
Admission: RE | Admit: 2019-03-12 | Discharge: 2019-03-12 | Disposition: A | Discharge status: Home / Self Care | Payer: 59 | Source: Ambulatory Visit | Attending: Internal Medicine | Admitting: Internal Medicine

## 2019-03-12 ENCOUNTER — Other Ambulatory Visit: Payer: Self-pay

## 2019-03-12 DIAGNOSIS — Z1231 Encounter for screening mammogram for malignant neoplasm of breast: Secondary | ICD-10-CM | POA: Insufficient documentation

## 2019-03-25 ENCOUNTER — Other Ambulatory Visit: Payer: Self-pay

## 2019-03-26 ENCOUNTER — Ambulatory Visit (INDEPENDENT_AMBULATORY_CARE_PROVIDER_SITE_OTHER): Payer: 59 | Admitting: Obstetrics & Gynecology

## 2019-03-26 ENCOUNTER — Encounter: Payer: Self-pay | Admitting: Obstetrics & Gynecology

## 2019-03-26 VITALS — BP 128/84 | Ht 66.0 in | Wt 177.0 lb

## 2019-03-26 DIAGNOSIS — Z90710 Acquired absence of both cervix and uterus: Secondary | ICD-10-CM

## 2019-03-26 DIAGNOSIS — Z01419 Encounter for gynecological examination (general) (routine) without abnormal findings: Secondary | ICD-10-CM

## 2019-03-26 DIAGNOSIS — Z780 Asymptomatic menopausal state: Secondary | ICD-10-CM | POA: Diagnosis not present

## 2019-03-26 DIAGNOSIS — Z1272 Encounter for screening for malignant neoplasm of vagina: Secondary | ICD-10-CM

## 2019-03-26 NOTE — Progress Notes (Signed)
° ° °[name] 03/24/1971 [id number] ° ° °History:    47 y.o. G3P2A1L2 married.  Husband >20 yrs older with Lung Ca. ° °RP:  New (>3 yrs) patient presenting for annual gyn exam  ° °HPI: S/P LAVH in 2012.  Postmenopause, well on no HRT.  Mild occasional hot flushes.  No pelvic pain.  No pain with IC.  Breasts normal.  Mother deceased from Breast Ca.  Patient is BrCa 1-2 negative.  BMI 28. 57.  Walking her dog regularly. Rt kidney insufficiency stable.  Health labs with Dr Paz. ° °Past medical history,surgical history, family history and social history were all reviewed and documented in the EPIC chart. ° °Gynecologic History °No LMP recorded. Patient has had a hysterectomy. °Contraception: status post hysterectomy °Last Pap: 10/2010. Results were: Negative °Last mammogram: 02/2019. Results were: Negative °Bone Density: Never °Colonoscopy: Never ° °Obstetric History °OB History  °Gravida Para Term Preterm AB Living  °3 2     1 2  °SAB TAB Ectopic Multiple Live Births  °           °  °# Outcome Date GA Lbr Len/2nd Weight Sex Delivery Anes PTL Lv  °3 AB           °2 Para           °1 Para           ° ° ° °ROS: A ROS was performed and pertinent positives and negatives are included in the history. ° GENERAL: No fevers or chills. HEENT: No change in vision, no earache, sore throat or sinus congestion. NECK: No pain or stiffness. CARDIOVASCULAR: No chest pain or pressure. No palpitations. PULMONARY: No shortness of breath, cough or wheeze. GASTROINTESTINAL: No abdominal pain, nausea, vomiting or diarrhea, melena or bright red blood per rectum. GENITOURINARY: No urinary frequency, urgency, hesitancy or dysuria. MUSCULOSKELETAL: No joint or muscle pain, no back pain, no recent trauma. DERMATOLOGIC: No rash, no itching, no lesions. ENDOCRINE: No polyuria, polydipsia, no heat or cold intolerance. No recent change in weight. HEMATOLOGICAL: No anemia or easy bruising or bleeding. NEUROLOGIC: No headache, seizures, numbness,  tingling or weakness. PSYCHIATRIC: No depression, no loss of interest in normal activity or change in sleep pattern.  °  ° °Exam: ° ° °BP 128/84    Ht 5' 6" (1.676 m)    Wt 177 lb (80.3 kg)    BMI 28.57 kg/m²  ° °Body mass index is 28.57 kg/m². ° °General appearance : Well developed well nourished female. No acute distress °HEENT: Eyes: no retinal hemorrhage or exudates,  Neck supple, trachea midline, no carotid bruits, no thyroidmegaly °Lungs: Clear to auscultation, no rhonchi or wheezes, or rib retractions  °Heart: Regular rate and rhythm, no murmurs or gallops °Breast:Examined in sitting and supine position were symmetrical in appearance, no palpable masses or tenderness,  no skin retraction, no nipple inversion, no nipple discharge, no skin discoloration, no axillary or supraclavicular lymphadenopathy °Abdomen: no palpable masses or tenderness, no rebound or guarding °Extremities: no edema or skin discoloration or tenderness ° °Pelvic: Vulva: Normal °            Vagina: No gross lesions or discharge.  Pap reflex done. ° Cervix/Uterus absent ° Adnexa  Without masses or tenderness ° Anus: Normal ° ° °Assessment/Plan:  47 y.o. female for annual exam  ° °1. Encounter for Papanicolaou smear of vagina as part of routine gynecological examination °Gynecologic exam status post total hysterectomy and menopause.  Pap reflex   reflex done on the vaginal vault.  Breast exam normal.  Screening mammogram in July 2028 was negative.  Body mass index 28.57.  Recommend a slightly lower calorie/carb diet such as [employer].  Aerobic physical activities to continue 5 times a week and weightlifting recommended every 2 days.  Health labs with Dr. Larose Kells.  2. S/P total hysterectomy  3. Postmenopause Well on no hormone replacement therapy.  Recommend vitamin D supplements, calcium intake of 1200 mg daily and regular weightbearing physical activities.  Princess Bruins MD, 8:34 AM 03/26/2019

## 2019-03-26 NOTE — Patient Instructions (Signed)
1. Encounter for Papanicolaou smear of vagina as part of routine gynecological examination Gynecologic exam status post total hysterectomy and menopause.  Pap reflex done on the vaginal vault.  Breast exam normal.  Screening mammogram in July 2028 was negative.  Body mass index 28.57.  Recommend a slightly lower calorie/carb diet such as [employer].  Aerobic physical activities to continue 5 times a week and weightlifting recommended every 2 days.  Health labs with Dr. Larose Kells.  2. S/P total hysterectomy  3. Postmenopause Well on no hormone replacement therapy.  Recommend vitamin D supplements, calcium intake of 1200 mg daily and regular weightbearing physical activities.  [place], it was a pleasure meeting you today!  I will inform you of your results as soon as they are available.

## 2019-03-29 LAB — PAP IG W/ RFLX HPV ASCU

## 2019-04-29 ENCOUNTER — Other Ambulatory Visit: Payer: Self-pay | Admitting: Internal Medicine

## 2019-07-27 ENCOUNTER — Other Ambulatory Visit: Payer: Self-pay | Admitting: Internal Medicine

## 2019-08-18 ENCOUNTER — Other Ambulatory Visit: Payer: Self-pay | Admitting: Internal Medicine

## 2019-09-10 ENCOUNTER — Other Ambulatory Visit: Payer: Self-pay | Admitting: Internal Medicine

## 2019-09-20 ENCOUNTER — Other Ambulatory Visit: Payer: Self-pay | Admitting: Internal Medicine

## 2019-10-18 ENCOUNTER — Other Ambulatory Visit: Payer: Self-pay | Admitting: Internal Medicine

## 2019-10-21 ENCOUNTER — Telehealth: Payer: Self-pay | Admitting: Internal Medicine

## 2019-10-22 NOTE — Telephone Encounter (Signed)
Medication: metoprolol succinate (TOPROL-XL) 50 MG 24 hr tablet [[id number]   Has the patient contacted their pharmacy? Yes.   (If no, request that the patient contact the pharmacy for the refill.) (If yes, when and what did the pharmacy advise?)  Preferred Pharmacy (with phone number or street name):  [id number] [place] [place] - [address] Phone:  [phone]  Fax:  [phone]        Agent: Please be advised that RX refills may take up to 3 business days. We ask that you follow-up with your pharmacy.

## 2019-10-22 NOTE — Telephone Encounter (Signed)
Patient notified that rx has been sent in. 

## 2019-10-25 ENCOUNTER — Other Ambulatory Visit: Payer: Self-pay

## 2019-10-25 ENCOUNTER — Encounter: Payer: Self-pay | Admitting: Internal Medicine

## 2019-10-25 ENCOUNTER — Ambulatory Visit (INDEPENDENT_AMBULATORY_CARE_PROVIDER_SITE_OTHER): Payer: 59 | Admitting: Internal Medicine

## 2019-10-25 VITALS — Ht 66.0 in

## 2019-10-25 DIAGNOSIS — F17200 Nicotine dependence, unspecified, uncomplicated: Secondary | ICD-10-CM

## 2019-10-25 DIAGNOSIS — F439 Reaction to severe stress, unspecified: Secondary | ICD-10-CM

## 2019-10-25 DIAGNOSIS — I10 Essential (primary) hypertension: Secondary | ICD-10-CM | POA: Diagnosis not present

## 2019-10-25 MED ORDER — METOPROLOL SUCCINATE ER 50 MG PO TB24: 50.0000 mg | ORAL_TABLET | Freq: Every day | ORAL | 6 refills | Status: DC

## 2019-10-25 NOTE — Progress Notes (Signed)
Subjective:    Patient ID: [name], female    DOB: 03-20-1971, 49 y.o.   MRN: [id number]  DOS:  10/25/2019 Type of visit - description: Virtual Visit via Telephone  Attempted  to make this a video visit, due to technical difficulties from the patient side it was not possible  thus we proceeded with a Virtual Visit via Telephone    I connected with above mentioned patient  by telephone and verified that I am speaking with the correct person using two identifiers.  THIS ENCOUNTER IS A VIRTUAL VISIT DUE TO COVID-19 - PATIENT WAS NOT SEEN IN THE OFFICE. PATIENT HAS CONSENTED TO VIRTUAL VISIT / TELEMEDICINE VISIT   Location of patient: home  Location of provider: office  I discussed the limitations, risks, security and privacy concerns of performing an evaluation and management service by telephone and the availability of in person appointments. I also discussed with the patient that there may be a patient responsible charge related to this service. The patient expressed understanding and agreed to proceed.  Follow-up Today we talk about hypertension, anxiety, stress and tobacco abuse.    Review of Systems In general she feels well, denies any major physical symptoms.  Despite the stress, she is doing well emotionally  Past Medical History:  Diagnosis Date  . Abnormal vaginal Pap smear   . Abortion history    x1  . Anxiety   . BRCA1 negative 11/12/10   NEGATIVE MUTATION  . BRCA2 negative 11/12/10   NEGATIVE MUTATION  . Chronic cystitis   . Herpes simplex    HSV  . Hypertension   . Miscarriage    x1   . Nephrolithiasis 1999   rt kidney functions @ 40%, sees urology routinely (Dr.Peterson), kidney stones, recurrent  . Tobacco abuse     Past Surgical History:  Procedure Laterality Date  . CESAREAN SECTION     X 2 W BTSP  . ENDOMETRIAL ABLATION    . LAPAROSCOPIC TOTAL HYSTERECTOMY  01/31/2011   TLH, B oophorectomy  . TONSILLECTOMY    . TUBAL LIGATION       Allergies as of 10/25/2019      Reactions   Ciprofloxacin Anaphylaxis, Nausea And Vomiting   Codeine Anaphylaxis, Nausea And Vomiting   Demerol Anaphylaxis   Hydrocodone Anaphylaxis, Hives   Levofloxacin Anaphylaxis   Nortriptyline Hcl Anaphylaxis   REACTION: irritability--"anger"   Ultram [tramadol Hcl] Anaphylaxis   Oxycodone-acetaminophen Itching   Prednisone Nausea And Vomiting      Medication List       Accurate as of October 25, 2019 11:59 PM. If you have any questions, ask your nurse or doctor.        STOP taking these medications   clonazePAM 0.5 MG tablet Commonly known as: KLONOPIN Stopped by: Kathlene November, MD     TAKE these medications   aspirin 81 MG tablet Take 81 mg by mouth daily.   fish oil-omega-3 fatty acids 1000 MG capsule Take 2 g by mouth daily.   metoprolol succinate 50 MG 24 hr tablet Commonly known as: TOPROL-XL Take 1 tablet (50 mg total) by mouth daily. Take with or immediately following a meal.   multivitamin capsule Take 1 capsule by mouth daily.   valACYclovir 500 MG tablet Commonly known as: VALTREX Take 1 tablet (500 mg total) by mouth daily as needed.          Objective:   Physical Exam Ht '5\' 6"'  (1.676 m)   BMI  28.57 kg/m  This is a virtual telephone visit, she sounded well, alert oriented x3    Assessment      Assessment Hyperglycemia A1c 5.9 HTN Anxiety Tobacco abuse-- tried Wellbutrin years ago "crazy thoughts" Chronic cystitis Kidney stones Hysterectomy, h/o abnormal PAPs   Herpes genital, valtrex prn BRCA 1-2 neg Vit D def (dx 2018, did not tolerate well ergocalciferol)  PLAN  HTN: Needs meds refilled, will do for 6 months. Ambulatory BPs when check they are very good. Tobacco abuse: Still smoking, has thoughts about quitting but has not proceded yet. + Stress, husband recently had a biopsy results pending.  She is managing the stress well, currently taking no medications. Preventive care: Saw gynecology  03-2019 RTC 4 to 6 months CPX   12 minutes  I discussed the assessment and treatment plan with the patient. The patient was provided an opportunity to ask questions and all were answered. The patient agreed with the plan and demonstrated an understanding of the instructions.   The patient was advised to call back or seek an in-person evaluation if the symptoms worsen or if the condition fails to improve as anticipated.  I provided 12 minutes of non-face-to-face time during this encounter.

## 2019-10-25 NOTE — Progress Notes (Signed)
Pre visit review using our clinic review tool, if applicable. No additional management support is needed unless otherwise documented below in the visit note. 

## 2019-10-26 NOTE — Assessment & Plan Note (Signed)
HTN: Needs meds refilled, will do for 6 months. Ambulatory BPs when check they are very good. Tobacco abuse: Still smoking, has thoughts about quitting but has not proceded yet. + Stress, husband recently had a biopsy results pending.  She is managing the stress well, currently taking no medications. Preventive care: Saw gynecology 03-2019 RTC 4 to 6 months CPX

## 2020-04-07 ENCOUNTER — Ambulatory Visit: Payer: 59 | Admitting: Obstetrics & Gynecology

## 2020-04-12 ENCOUNTER — Other Ambulatory Visit: Payer: 59

## 2020-04-12 ENCOUNTER — Other Ambulatory Visit: Payer: Self-pay

## 2020-04-12 DIAGNOSIS — Z20822 Contact with and (suspected) exposure to covid-19: Secondary | ICD-10-CM

## 2020-04-13 ENCOUNTER — Ambulatory Visit: Payer: 59 | Admitting: Obstetrics & Gynecology

## 2020-04-14 ENCOUNTER — Other Ambulatory Visit: Payer: Self-pay | Admitting: Internal Medicine

## 2020-04-14 LAB — NOVEL CORONAVIRUS, NAA: SARS-CoV-2, NAA: NOT DETECTED

## 2020-07-07 ENCOUNTER — Other Ambulatory Visit: Payer: Self-pay | Admitting: Internal Medicine

## 2020-09-25 ENCOUNTER — Other Ambulatory Visit: Payer: Self-pay | Admitting: Internal Medicine

## 2020-10-17 ENCOUNTER — Other Ambulatory Visit: Payer: Self-pay | Admitting: Internal Medicine

## 2020-11-03 ENCOUNTER — Encounter: Payer: Self-pay | Admitting: Internal Medicine

## 2020-12-04 ENCOUNTER — Other Ambulatory Visit: Payer: Self-pay | Admitting: Internal Medicine

## 2020-12-11 ENCOUNTER — Ambulatory Visit (INDEPENDENT_AMBULATORY_CARE_PROVIDER_SITE_OTHER): Payer: Self-pay | Admitting: Internal Medicine

## 2020-12-11 ENCOUNTER — Encounter: Payer: Self-pay | Admitting: Internal Medicine

## 2020-12-11 ENCOUNTER — Other Ambulatory Visit: Payer: Self-pay

## 2020-12-11 VITALS — BP 129/81 | HR 64 | Temp 97.8°F | Ht 66.0 in | Wt 192.8 lb

## 2020-12-11 DIAGNOSIS — Z0000 Encounter for general adult medical examination without abnormal findings: Secondary | ICD-10-CM

## 2020-12-11 DIAGNOSIS — R739 Hyperglycemia, unspecified: Secondary | ICD-10-CM

## 2020-12-11 DIAGNOSIS — E785 Hyperlipidemia, unspecified: Secondary | ICD-10-CM

## 2020-12-11 DIAGNOSIS — I10 Essential (primary) hypertension: Secondary | ICD-10-CM

## 2020-12-11 DIAGNOSIS — E038 Other specified hypothyroidism: Secondary | ICD-10-CM

## 2020-12-11 MED ORDER — METOPROLOL SUCCINATE ER 50 MG PO TB24: 50.0000 mg | ORAL_TABLET | Freq: Every day | ORAL | 3 refills | Status: DC

## 2020-12-11 MED ORDER — VALACYCLOVIR HCL 500 MG PO TABS: 500.0000 mg | ORAL_TABLET | Freq: Every day | ORAL | 6 refills | Status: DC | PRN

## 2020-12-11 NOTE — Progress Notes (Signed)
Subjective:    Patient ID: [name], female    DOB: 11-Jul-1971, 50 y.o.   MRN: [id number]  DOS:  12/11/2020 Type of visit - description: Here for CPX  Here for CPX In general feels well except for stress related to her job.   Review of Systems  Other than above, a 14 point review of systems is negative      Past Medical History:  Diagnosis Date  . Abnormal vaginal Pap smear   . Abortion history    x1  . Anxiety   . BRCA1 negative 11/12/10   NEGATIVE MUTATION  . BRCA2 negative 11/12/10   NEGATIVE MUTATION  . Chronic cystitis   . Herpes simplex    HSV  . Hypertension   . Miscarriage    x1   . Nephrolithiasis 1999   rt kidney functions @ 40%, sees urology routinely (Dr.Peterson), kidney stones, recurrent  . Tobacco abuse     Past Surgical History:  Procedure Laterality Date  . CESAREAN SECTION     X 2 W BTSP  . ENDOMETRIAL ABLATION    . LAPAROSCOPIC TOTAL HYSTERECTOMY  01/31/2011   TLH, B oophorectomy  . TONSILLECTOMY    . TUBAL LIGATION     Family History  Problem Relation Age of Onset  . Diabetes Father   . Hypertension Father   . Heart attack Father        First MI @ 76 - total of 69, deceased.  . Diabetes Mother   . Breast cancer Mother        deceased.  . Hypertension Mother   . Heart attack Mother        First MI @ 40, CABG in early 4's.  . Breast cancer Maternal Grandmother   . Colon cancer Neg Hx   . Uterine cancer Neg Hx   . Ovarian cancer Neg Hx      Allergies as of 12/11/2020      Reactions   Ciprofloxacin Anaphylaxis, Nausea And Vomiting   Codeine Anaphylaxis, Nausea And Vomiting   Demerol Anaphylaxis   Hydrocodone Anaphylaxis, Hives   Levofloxacin Anaphylaxis   Nortriptyline Hcl Anaphylaxis   REACTION: irritability--"anger"   Ultram [tramadol Hcl] Anaphylaxis   Oxycodone-acetaminophen Itching   Prednisone Nausea And Vomiting      Medication List       Accurate as of Dec 11, 2020 11:59 PM. If you have any questions, ask  your nurse or doctor.        aspirin 81 MG tablet Take 81 mg by mouth daily.   fish oil-omega-3 fatty acids 1000 MG capsule Take 2 g by mouth daily.   metoprolol succinate 50 MG 24 hr tablet Commonly known as: TOPROL-XL Take 1 tablet (50 mg total) by mouth daily. TAKE WITH OR IMMEDIATELY FOLLOWING A MEAL.   multivitamin capsule Take 1 capsule by mouth daily.   valACYclovir 500 MG tablet Commonly known as: VALTREX Take 1 tablet (500 mg total) by mouth daily as needed.          Objective:   Physical Exam BP 129/81 (BP Location: Right Arm, Patient Position: Sitting, Cuff Size: Large)   Pulse 64   Temp 97.8 F (36.6 C) (Temporal)   Ht '5\' 6"'  (1.676 m)   Wt 192 lb 12.8 oz (87.5 kg)   SpO2 98%   BMI 31.12 kg/m  General: Well developed, NAD, BMI noted Neck: No  thyromegaly  HEENT:  Normocephalic . Face symmetric, atraumatic Lungs:  CTA  B Normal respiratory effort, no intercostal retractions, no accessory muscle use. Heart: RRR,  no murmur.  Abdomen:  Not distended, soft, non-tender. No rebound or rigidity.   Lower extremities: no pretibial edema bilaterally  Skin: Exposed areas without rash. Not pale. Not jaundice Neurologic:  alert & oriented X3.  Speech normal, gait appropriate for age and unassisted Strength symmetric and appropriate for age.  Psych: Cognition and judgment appear intact.  Cooperative with normal attention span and concentration.  Behavior appropriate. No anxious or depressed appearing.     Assessment      Assessment Hyperglycemia A1c 5.9 HTN Anxiety Tobacco abuse-- tried Wellbutrin years ago "crazy thoughts" Chronic cystitis Kidney stones Hysterectomy ~ 2012, h/o abnormal PAPs   Herpes genital, valtrex prn BRCA 1-2 neg Vit D def (dx 2018, did not tolerate well ergocalciferol)  PLAN  Here for CPX Hyperglycemia: Checking labs HTN: On metoprolol.  BP today is very good.  Checking labs Tobacco: Ongoing issue, intolerant to  Wellbutrin, we talk about possibly a nicotine patch but she is not ready to quit Herpes: Refill Valtrex, uses as needed RTC 1 year  This visit occurred during the SARS-CoV-2 public health emergency.  Safety protocols were in place, including screening questions prior to the visit, additional usage of staff PPE, and extensive cleaning of exam room while observing appropriate contact time as indicated for disinfecting solutions.

## 2020-12-11 NOTE — Patient Instructions (Addendum)
   GO TO THE LAB : Get the blood work     GO TO THE FRONT DESK, PLEASE SCHEDULE YOUR APPOINTMENTS Come back for a physical exam in 1 year 

## 2020-12-12 ENCOUNTER — Encounter: Payer: Self-pay | Admitting: Internal Medicine

## 2020-12-12 LAB — CBC WITH DIFFERENTIAL/PLATELET
Basophils Absolute: 0 10*3/uL (ref 0.0–0.1)
Basophils Relative: 0.8 % (ref 0.0–3.0)
Eosinophils Absolute: 0.2 10*3/uL (ref 0.0–0.7)
Eosinophils Relative: 2.7 % (ref 0.0–5.0)
HCT: 43.5 % (ref 36.0–46.0)
Hemoglobin: 14.8 g/dL (ref 12.0–15.0)
Lymphocytes Relative: 33.4 % (ref 12.0–46.0)
Lymphs Abs: 2.1 10*3/uL (ref 0.7–4.0)
MCHC: 34 g/dL (ref 30.0–36.0)
MCV: 89.7 fl (ref 78.0–100.0)
Monocytes Absolute: 0.5 10*3/uL (ref 0.1–1.0)
Monocytes Relative: 7.4 % (ref 3.0–12.0)
Neutro Abs: 3.5 10*3/uL (ref 1.4–7.7)
Neutrophils Relative %: 55.7 % (ref 43.0–77.0)
Platelets: 196 10*3/uL (ref 150.0–400.0)
RBC: 4.85 Mil/uL (ref 3.87–5.11)
RDW: 13.4 % (ref 11.5–15.5)
WBC: 6.3 10*3/uL (ref 4.0–10.5)

## 2020-12-12 LAB — LIPID PANEL
Cholesterol: 246 mg/dL — ABNORMAL HIGH (ref 0–200)
HDL: 36.9 mg/dL — ABNORMAL LOW (ref >39.00)
LDL Cholesterol: 175 mg/dL — ABNORMAL HIGH (ref 0–99)
NonHDL: 209.37
Total CHOL/HDL Ratio: 7
Triglycerides: 171 mg/dL — ABNORMAL HIGH (ref 0.0–149.0)
VLDL: 34.2 mg/dL (ref 0.0–40.0)

## 2020-12-12 LAB — COMPREHENSIVE METABOLIC PANEL
ALT: 11 U/L (ref 0–35)
AST: 11 U/L (ref 0–37)
Albumin: 4.4 g/dL (ref 3.5–5.2)
Alkaline Phosphatase: 88 U/L (ref 39–117)
BUN: 10 mg/dL (ref 6–23)
CO2: 24 mEq/L (ref 19–32)
Calcium: 9.4 mg/dL (ref 8.4–10.5)
Chloride: 104 mEq/L (ref 96–112)
Creatinine, Ser: 0.73 mg/dL (ref 0.40–1.20)
GFR: 96.42 mL/min (ref >60.00)
Glucose, Bld: 81 mg/dL (ref 70–99)
Potassium: 4 mEq/L (ref 3.5–5.1)
Sodium: 137 mEq/L (ref 135–145)
Total Bilirubin: 0.4 mg/dL (ref 0.2–1.2)
Total Protein: 7 g/dL (ref 6.0–8.3)

## 2020-12-12 LAB — HEMOGLOBIN A1C: Hgb A1c MFr Bld: 5.7 % (ref 4.6–6.5)

## 2020-12-12 LAB — TSH: TSH: 5.19 u[IU]/mL — ABNORMAL HIGH (ref 0.35–4.50)

## 2020-12-12 NOTE — Assessment & Plan Note (Signed)
Here for CPX Hyperglycemia: Checking labs HTN: On metoprolol.  BP today is very good.  Checking labs Tobacco: Ongoing issue, intolerant to Wellbutrin, we talk about possibly a nicotine patch but she is not ready to quit Herpes: Refill Valtrex, uses as needed RTC 1 year

## 2020-12-12 NOTE — Assessment & Plan Note (Signed)
-  Td 2009, booster rec , likes to wait  -COVID VAX: d/w  Pro>>cons, she remains reluctant to proceed --Female care:  PAP 03/2019 and MMG 03/12/2019 ([facility]); rec to call gyn --Bones:  (+) surgical menopause: Rx dexa declines, $$ --CCS: never had a csope, no insurance, IFOB?  Declined it, cost is an issue. --Diet, exercise: not doing well, counseled!  Calorie counting? -Labs: CMP, FLP, CBC, A1c, TSH

## 2020-12-13 NOTE — Addendum Note (Signed)
Addended byConrad Maloy D on: 12/13/2020 04:59 PM   Modules accepted: Orders

## 2021-09-29 ENCOUNTER — Ambulatory Visit
Admission: EM | Admit: 2021-09-29 | Discharge: 2021-09-29 | Disposition: A | Discharge status: Home / Self Care | Payer: BC Managed Care – PPO | Attending: Physician Assistant | Admitting: Physician Assistant

## 2021-09-29 ENCOUNTER — Encounter: Payer: Self-pay | Admitting: Emergency Medicine

## 2021-09-29 ENCOUNTER — Other Ambulatory Visit: Payer: Self-pay

## 2021-09-29 DIAGNOSIS — N3000 Acute cystitis without hematuria: Secondary | ICD-10-CM

## 2021-09-29 LAB — POCT URINALYSIS DIP (MANUAL ENTRY)
Bilirubin, UA: NEGATIVE
Glucose, UA: NEGATIVE mg/dL
Ketones, POC UA: NEGATIVE mg/dL
Nitrite, UA: NEGATIVE
Protein Ur, POC: NEGATIVE mg/dL
Spec Grav, UA: <=1.005 — AB (ref 1.010–1.025)
Urobilinogen, UA: 0.2 E.U./dL
pH, UA: 5.5 (ref 5.0–8.0)

## 2021-09-29 MED ORDER — NITROFURANTOIN MONOHYD MACRO 100 MG PO CAPS: 100.0000 mg | ORAL_CAPSULE | Freq: Two times a day (BID) | ORAL | 0 refills | Status: DC

## 2021-09-29 NOTE — ED Provider Notes (Signed)
[facility]    CSN: [id number] Arrival date & time: 09/29/21  0802      History   Chief Complaint Chief Complaint  Patient presents with   Possible UTI    HPI [name] is a 51 y.o. female.   Patient here today for evaluation of urinary pressure, urinary urgency and frequency that she has had for the last day.  She reports that she has tried taking Azo without significant relief.  She has not had fever.  She denies any nausea or vomiting.  She does report some blood in her urine yesterday.  The history is provided by the patient.   Past Medical History:  Diagnosis Date   Abnormal vaginal Pap smear    Abortion history    x1   Anxiety    BRCA1 negative 11/12/10   NEGATIVE MUTATION   BRCA2 negative 11/12/10   NEGATIVE MUTATION   Chronic cystitis    Herpes simplex    HSV   Hypertension    Miscarriage    x1    Nephrolithiasis 1999   rt kidney functions @ 40%, sees urology routinely (Dr.Peterson), kidney stones, recurrent   Tobacco abuse     Patient Active Problem List   Diagnosis Date Noted   Hyperglycemia 06/21/2016   PCP NOTES >>>>>>>>>>>>> 12/26/2015   Annual physical exam 12/21/2014   Smoker 12/03/2013   Family history of breast cancer 12/03/2013   HTN (hypertension) 06/11/2011   Headache(784.0) 06/11/2011   Genital herpes 05/08/2009   Anxiety state 05/08/2009   UNSPECIFIED URINARY CALCULUS 05/31/2008   URINARY TRACT INFECTION, RECURRENT 03/14/2008    Past Surgical History:  Procedure Laterality Date   CESAREAN SECTION     X 2 W BTSP   ENDOMETRIAL ABLATION     LAPAROSCOPIC TOTAL HYSTERECTOMY  01/31/2011   TLH, B oophorectomy   TONSILLECTOMY     TUBAL LIGATION      OB History     Gravida  3   Para  2   Term      Preterm      AB  1   Living  2      SAB      IAB      Ectopic      Multiple      Live Births               Home Medications    Prior to Admission medications   Medication Sig Start Date  End Date Taking? Authorizing Provider  aspirin 81 MG tablet Take 81 mg by mouth daily.   Yes [provider]  fish oil-omega-3 fatty acids 1000 MG capsule Take 2 g by mouth daily.   Yes [provider]  metoprolol succinate (TOPROL-XL) 50 MG 24 hr tablet Take 1 tablet (50 mg total) by mouth daily. TAKE WITH OR IMMEDIATELY FOLLOWING A MEAL. 12/11/20  Yes Colon Branch, MD  Multiple Vitamin (MULTIVITAMIN) capsule Take 1 capsule by mouth daily.   Yes [provider]  nitrofurantoin, macrocrystal-monohydrate, (MACROBID) 100 MG capsule Take 1 capsule (100 mg total) by mouth 2 (two) times daily. 09/29/21  Yes Francene Finders, PA-C  valACYclovir (VALTREX) 500 MG tablet Take 1 tablet (500 mg total) by mouth daily as needed. 12/11/20  Yes Colon Branch, MD    Family History Family History  Problem Relation Age of Onset   Diabetes Father    Hypertension Father    Heart attack Father  First MI @ 66 - total of 5, deceased.   Diabetes Mother    Breast cancer Mother        deceased.   Hypertension Mother    Heart attack Mother        First MI @ 38, CABG in early 14's.   Breast cancer Maternal Grandmother    Colon cancer Neg Hx    Uterine cancer Neg Hx    Ovarian cancer Neg Hx     Social History Social History   Tobacco Use   Smoking status: Every Day    Packs/day: 1.00    Types: Cigarettes   Smokeless tobacco: Never   Tobacco comments:    1 PPD  Vaping Use   Vaping Use: Never used  Substance Use Topics   Alcohol use: Yes    Alcohol/week: 0.0 standard drinks    Comment: rarely   Drug use: No    Comment: no recent marijuana     Allergies   Ciprofloxacin, Codeine, Demerol, Hydrocodone, Levofloxacin, Nortriptyline hcl, Ultram [tramadol hcl], Oxycodone-acetaminophen, and Prednisone   Review of Systems Review of Systems  Constitutional:  Negative for chills and fever.  Respiratory:  Negative for shortness of breath.   Cardiovascular:  Negative for chest  pain.  Gastrointestinal:  Negative for abdominal pain, nausea and vomiting.  Genitourinary:  Positive for dysuria, frequency and hematuria.  Musculoskeletal:  Negative for back pain.    Physical Exam Triage Vital Signs ED Triage Vitals  Enc Vitals Group     BP 09/29/21 0819 (!) 174/77     Pulse Rate 09/29/21 0819 75     Resp --      Temp 09/29/21 0819 98.4 F (36.9 C)     Temp Source 09/29/21 0819 Oral     SpO2 09/29/21 0819 98 %     Weight 09/29/21 0820 180 lb (81.6 kg)     Height 09/29/21 0820 '5\' 7"'  (1.702 m)     Head Circumference --      Peak Flow --      Pain Score 09/29/21 0820 8     Pain Loc --      Pain Edu? --      Excl. in [place]? --    No data found.  Updated Vital Signs BP (!) 174/77 (BP Location: Left Arm)    Pulse 75    Temp 98.4 F (36.9 C) (Oral)    Ht '5\' 7"'  (1.702 m)    Wt 180 lb (81.6 kg)    SpO2 98%    BMI 28.19 kg/m      Physical Exam Vitals and nursing note reviewed.  Constitutional:      General: She is not in acute distress.    Appearance: Normal appearance. She is not ill-appearing.  HENT:     Head: Normocephalic and atraumatic.     Nose: Nose normal.  Cardiovascular:     Rate and Rhythm: Normal rate and regular rhythm.     Heart sounds: Normal heart sounds. No murmur heard. Pulmonary:     Effort: Pulmonary effort is normal. No respiratory distress.     Breath sounds: Normal breath sounds. No wheezing, rhonchi or rales.  Skin:    General: Skin is warm and dry.  Neurological:     Mental Status: She is alert.  Psychiatric:        Mood and Affect: Mood normal.        Thought Content: Thought content normal.     UC Treatments /  Results  Labs (all labs ordered are listed, but only abnormal results are displayed) Labs Reviewed  POCT URINALYSIS DIP (MANUAL ENTRY) - Abnormal; Notable for the following components:      Result Value   Clarity, UA hazy (*)    Spec Grav, UA <=1.005 (*)    Blood, UA trace-lysed (*)    Leukocytes, UA Small (1+)  (*)    All other components within normal limits  URINE CULTURE    EKG   Radiology No results found.  Procedures Procedures (including critical care time)  Medications Ordered in UC Medications - No data to display  Initial Impression / Assessment and Plan / UC Course  I have reviewed the triage vital signs and the nursing notes.  Pertinent labs & imaging results that were available during my care of the patient were reviewed by me and considered in my medical decision making (see chart for details).    Macrobid prescribed for UTI.  Will order urine culture.  Encouraged follow-up if symptoms fail to improve or worsen.  Final Clinical Impressions(s) / UC Diagnoses   Final diagnoses:  Acute cystitis without hematuria   Discharge Instructions   None    ED Prescriptions     Medication Sig Dispense Auth. Provider   nitrofurantoin, macrocrystal-monohydrate, (MACROBID) 100 MG capsule Take 1 capsule (100 mg total) by mouth 2 (two) times daily. 10 capsule Francene Finders, PA-C      PDMP not reviewed this encounter.   Francene Finders, PA-C 09/29/21 [phone]

## 2021-09-29 NOTE — ED Triage Notes (Signed)
Patient c/o urinary pressure, urgency and frequency x 1 day.  Patient did start taking AZO OTC.

## 2021-10-01 LAB — URINE CULTURE: Culture: 70000 — AB

## 2021-10-10 ENCOUNTER — Encounter: Payer: Self-pay | Admitting: Internal Medicine

## 2021-12-15 ENCOUNTER — Other Ambulatory Visit: Payer: Self-pay | Admitting: Internal Medicine

## 2022-03-13 ENCOUNTER — Other Ambulatory Visit: Payer: Self-pay | Admitting: Internal Medicine

## 2022-03-23 ENCOUNTER — Other Ambulatory Visit: Payer: Self-pay | Admitting: Internal Medicine

## 2022-04-02 ENCOUNTER — Other Ambulatory Visit: Payer: Self-pay

## 2022-04-02 MED ORDER — METOPROLOL SUCCINATE ER 50 MG PO TB24: 50.0000 mg | ORAL_TABLET | Freq: Every day | ORAL | 0 refills | Status: DC

## 2022-04-10 ENCOUNTER — Encounter: Payer: Self-pay | Admitting: Internal Medicine

## 2022-04-24 ENCOUNTER — Ambulatory Visit (INDEPENDENT_AMBULATORY_CARE_PROVIDER_SITE_OTHER): Payer: BC Managed Care – PPO | Admitting: Internal Medicine

## 2022-04-24 ENCOUNTER — Encounter: Payer: Self-pay | Admitting: Internal Medicine

## 2022-04-24 VITALS — BP 126/82 | HR 76 | Temp 97.8°F | Resp 18 | Ht 66.0 in | Wt 191.5 lb

## 2022-04-24 DIAGNOSIS — E785 Hyperlipidemia, unspecified: Secondary | ICD-10-CM

## 2022-04-24 DIAGNOSIS — E559 Vitamin D deficiency, unspecified: Secondary | ICD-10-CM | POA: Diagnosis not present

## 2022-04-24 DIAGNOSIS — Z780 Asymptomatic menopausal state: Secondary | ICD-10-CM

## 2022-04-24 DIAGNOSIS — Z01419 Encounter for gynecological examination (general) (routine) without abnormal findings: Secondary | ICD-10-CM

## 2022-04-24 DIAGNOSIS — Z1211 Encounter for screening for malignant neoplasm of colon: Secondary | ICD-10-CM

## 2022-04-24 DIAGNOSIS — R739 Hyperglycemia, unspecified: Secondary | ICD-10-CM | POA: Diagnosis not present

## 2022-04-24 DIAGNOSIS — Z0000 Encounter for general adult medical examination without abnormal findings: Secondary | ICD-10-CM | POA: Diagnosis not present

## 2022-04-24 DIAGNOSIS — Z8249 Family history of ischemic heart disease and other diseases of the circulatory system: Secondary | ICD-10-CM

## 2022-04-24 DIAGNOSIS — I10 Essential (primary) hypertension: Secondary | ICD-10-CM | POA: Diagnosis not present

## 2022-04-24 DIAGNOSIS — E038 Other specified hypothyroidism: Secondary | ICD-10-CM | POA: Diagnosis not present

## 2022-04-24 DIAGNOSIS — Z1159 Encounter for screening for other viral diseases: Secondary | ICD-10-CM | POA: Diagnosis not present

## 2022-04-24 DIAGNOSIS — Z1231 Encounter for screening mammogram for malignant neoplasm of breast: Secondary | ICD-10-CM

## 2022-04-24 NOTE — Patient Instructions (Addendum)
Stop by the first floor and schedule a mammogram and a bone density test  Call your gynecologist and get an appointment. Dr Seymour Bars  [phone]  We will schedule a calcium coronary score.  Expect a phone call.  Call the gastroenterology office to schedule a colonoscopy [phone]  Vaccines I recommend: Tetanus shot, COVID and flu vaccines.  GO TO THE LAB : Get the blood work     GO TO THE FRONT DESK, PLEASE SCHEDULE YOUR APPOINTMENTS Come back for   a checkup in 3 months

## 2022-04-24 NOTE — Progress Notes (Signed)
Subjective:    Patient ID: [name], female    DOB: 04/30/1971, 51 y.o.   MRN: [id number]  DOS:  04/24/2022 Type of visit - description: cpx Here for CPX. In general doing well  Wt Readings from Last 3 Encounters:  04/24/22 191 lb 8 oz (86.9 kg)  09/29/21 180 lb (81.6 kg)  12/11/20 192 lb 12.8 oz (87.5 kg)    Review of Systems   A 14 point review of systems is negative    Past Medical History:  Diagnosis Date   Abnormal vaginal Pap smear    Abortion history    x1   Anxiety    BRCA1 negative 11/12/10   NEGATIVE MUTATION   BRCA2 negative 11/12/10   NEGATIVE MUTATION   Chronic cystitis    Herpes simplex    HSV   Hypertension    Miscarriage    x1    Nephrolithiasis 1999   rt kidney functions @ 40%, sees urology routinely (Dr.Peterson), kidney stones, recurrent   Tobacco abuse     Past Surgical History:  Procedure Laterality Date   CESAREAN SECTION     X 2 W BTSP   ENDOMETRIAL ABLATION     LAPAROSCOPIC TOTAL HYSTERECTOMY  01/31/2011   TLH, B oophorectomy   TONSILLECTOMY     TUBAL LIGATION     Social History   Socioeconomic History   Marital status: Married    Spouse name: Not on file   Number of children: 2   Years of education: Not on file   Highest education level: Not on file  Occupational History   Occupation: Glass blower/designer  Tobacco Use   Smoking status: Former    Packs/day: 1.00    Types: Cigarettes    Quit date: 01/08/2022    Years since quitting: 0.2   Smokeless tobacco: Never   Tobacco comments:    1 PPD, quit ~ 12-2021  Vaping Use   Vaping Use: Never used  Substance and Sexual Activity   Alcohol use: Yes    Alcohol/week: 0.0 standard drinks of alcohol    Comment: rarely   Drug use: No    Comment: no recent marijuana   Sexual activity: Not Currently  Other Topics Concern   Not on file  Social History Narrative   Lost mom 2014   Lost father [place] 3/11   Lives locally with husband and 2 sons. (1995, 7)   Husband has a  daughter (from before they married)         Social Determinants of Radio broadcast assistant Strain: Not on file  Food Insecurity: Not on file  Transportation Needs: Not on file  Physical Activity: Not on file  Stress: Not on file  Social Connections: Not on file  Intimate Partner Violence: Not on file     Current Outpatient Medications  Medication Instructions   aspirin 81 mg, Oral, Daily,     fish oil-omega-3 fatty acids 2 g, Oral, Daily,     metoprolol succinate (TOPROL-XL) 50 mg, Oral, Daily, Take with or immediately following a meal.   Multiple Vitamin (MULTIVITAMIN) capsule 1 capsule, Oral, Daily,     valACYclovir (VALTREX) 500 mg, Oral, Daily PRN       Objective:   Physical Exam BP 126/82   Pulse 76   Temp 97.8 F (36.6 C) (Oral)   Resp 18   Ht '5\' 6"'  (1.676 m)   Wt 191 lb 8 oz (86.9 kg)   SpO2 98%  BMI 30.91 kg/m  General: Well developed, NAD, BMI noted Neck: No  thyromegaly  HEENT:  Normocephalic . Face symmetric, atraumatic Lungs:  CTA B Normal respiratory effort, no intercostal retractions, no accessory muscle use. Heart: RRR,  no murmur.  Abdomen:  Not distended, soft, non-tender. No rebound or rigidity.   Lower extremities: no pretibial edema bilaterally  Skin: Exposed areas without rash. Not pale. Not jaundice Neurologic:  alert & oriented X3.  Speech normal, gait appropriate for age and unassisted Strength symmetric and appropriate for age.  Psych: Cognition and judgment appear intact.  Cooperative with normal attention span and concentration.  Behavior appropriate. No anxious or depressed appearing.     Assessment     Assessment Hyperglycemia A1c 5.9 HTN Anxiety Tobacco abuse-- tried Wellbutrin years ago "crazy thoughts" Chronic cystitis Kidney stones Hysterectomy ~ 2012, h/o abnormal PAPs   Herpes genital, valtrex prn BRCA 1-2 neg Vit D def (dx 2018, did not tolerate well ergocalciferol)  PLAN  Here for  CPX Hyperglycemia: Check A1c HTN?  On no medications, BP is normal. Tobacco: Quit tobacco about 3 months ago, doing great, Praised. Vitamin D deficiency:  On a multivitamin, checking labs. Recommend vitamin D3 2000 units daily FH CAD: Father and mother had early CAD.  Errin used to smoke for many years, has hyperglycemia.  We talk about risk reduction and she is already on aspirin.  Statins?  The patient had an anaphylactic reaction to many medicines and she would be reluctant.  We agreed on a coronary calcium score to guide the decision of taking statins or not. 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

## 2022-04-25 ENCOUNTER — Encounter: Payer: Self-pay | Admitting: Internal Medicine

## 2022-04-25 LAB — CBC WITH DIFFERENTIAL/PLATELET
Basophils Absolute: 0.1 10*3/uL (ref 0.0–0.1)
Basophils Relative: 1.4 % (ref 0.0–3.0)
Eosinophils Absolute: 0.3 10*3/uL (ref 0.0–0.7)
Eosinophils Relative: 3.6 % (ref 0.0–5.0)
HCT: 45.1 % (ref 36.0–46.0)
Hemoglobin: 15.1 g/dL — ABNORMAL HIGH (ref 12.0–15.0)
Lymphocytes Relative: 38.3 % (ref 12.0–46.0)
Lymphs Abs: 2.8 10*3/uL (ref 0.7–4.0)
MCHC: 33.6 g/dL (ref 30.0–36.0)
MCV: 89.5 fl (ref 78.0–100.0)
Monocytes Absolute: 0.4 10*3/uL (ref 0.1–1.0)
Monocytes Relative: 5.9 % (ref 3.0–12.0)
Neutro Abs: 3.7 10*3/uL (ref 1.4–7.7)
Neutrophils Relative %: 50.8 % (ref 43.0–77.0)
Platelets: 202 10*3/uL (ref 150.0–400.0)
RBC: 5.04 Mil/uL (ref 3.87–5.11)
RDW: 13.4 % (ref 11.5–15.5)
WBC: 7.2 10*3/uL (ref 4.0–10.5)

## 2022-04-25 LAB — COMPREHENSIVE METABOLIC PANEL
ALT: 10 U/L (ref 0–35)
AST: 12 U/L (ref 0–37)
Albumin: 4.3 g/dL (ref 3.5–5.2)
Alkaline Phosphatase: 94 U/L (ref 39–117)
BUN: 13 mg/dL (ref 6–23)
CO2: 24 mEq/L (ref 19–32)
Calcium: 9.7 mg/dL (ref 8.4–10.5)
Chloride: 105 mEq/L (ref 96–112)
Creatinine, Ser: 0.94 mg/dL (ref 0.40–1.20)
GFR: 70.51 mL/min (ref >60.00)
Glucose, Bld: 112 mg/dL — ABNORMAL HIGH (ref 70–99)
Potassium: 3.9 mEq/L (ref 3.5–5.1)
Sodium: 140 mEq/L (ref 135–145)
Total Bilirubin: 0.4 mg/dL (ref 0.2–1.2)
Total Protein: 7.3 g/dL (ref 6.0–8.3)

## 2022-04-25 LAB — HEMOGLOBIN A1C: Hgb A1c MFr Bld: 6 % (ref 4.6–6.5)

## 2022-04-25 LAB — LIPID PANEL
Cholesterol: 268 mg/dL — ABNORMAL HIGH (ref 0–200)
HDL: 41.9 mg/dL (ref >39.00)
NonHDL: 226.56
Total CHOL/HDL Ratio: 6
Triglycerides: 305 mg/dL — ABNORMAL HIGH (ref 0.0–149.0)
VLDL: 61 mg/dL — ABNORMAL HIGH (ref 0.0–40.0)

## 2022-04-25 LAB — TSH: TSH: 2.59 u[IU]/mL (ref 0.35–5.50)

## 2022-04-25 LAB — T4, FREE: Free T4: 0.8 ng/dL (ref 0.60–1.60)

## 2022-04-25 LAB — VITAMIN D 25 HYDROXY (VIT D DEFICIENCY, FRACTURES): VITD: 9.84 ng/mL — ABNORMAL LOW (ref 30.00–100.00)

## 2022-04-25 LAB — HEPATITIS C ANTIBODY: Hepatitis C Ab: NONREACTIVE

## 2022-04-25 LAB — LDL CHOLESTEROL, DIRECT: Direct LDL: 257 mg/dL

## 2022-04-25 NOTE — Assessment & Plan Note (Signed)
Here for CPX Hyperglycemia: Check A1c HTN?  On no medications, BP is normal. Tobacco: Quit tobacco about 3 months ago, doing great, Praised. Vitamin D deficiency:  On a multivitamin, checking labs. Recommend vitamin D3 2000 units daily FH CAD: Father and mother had early CAD.  [name] used to smoke for many years, has hyperglycemia.  We talk about risk reduction and she is already on aspirin.  Statins?  The patient had an anaphylactic reaction to many medicines and she would be reluctant.  We agreed on a coronary calcium score to guide the decision of taking statins or not. Social: Changed jobs, Production designer, theatre/television/film for an Airline pilot office, really happy in her new position RTC 3 months

## 2022-04-25 NOTE — Assessment & Plan Note (Signed)
-  Td 2009, - Vaccines I recommend: Tdap, COVID and flu vaccines. --Female care: MMG ordered ; refer to Dr Seymour Bars --Bones:  h/o surgical menopause: Rx dexa  --CCS: never had a csope, previously declined [facility] but at this time she is interested.  GI referral. --Diet, exercise: not doing well, counseled!  Calorie counting? -Labs: CMP, FLP, CBC, TSH, free T4, vitamin D, hep C.  A1c

## 2022-04-26 MED ORDER — VITAMIN D (ERGOCALCIFEROL) 1.25 MG (50000 UNIT) PO CAPS: 50000.0000 [IU] | ORAL_CAPSULE | ORAL | 0 refills | Status: DC

## 2022-04-26 NOTE — Addendum Note (Signed)
Addended byConrad Mifflin D on: 04/26/2022 08:00 AM   Modules accepted: Orders

## 2022-05-13 ENCOUNTER — Ambulatory Visit (HOSPITAL_BASED_OUTPATIENT_CLINIC_OR_DEPARTMENT_OTHER)
Admission: RE | Admit: 2022-05-13 | Discharge: 2022-05-13 | Disposition: A | Discharge status: Home / Self Care | Payer: BC Managed Care – PPO | Source: Ambulatory Visit | Attending: Internal Medicine | Admitting: Internal Medicine

## 2022-05-13 ENCOUNTER — Encounter (HOSPITAL_BASED_OUTPATIENT_CLINIC_OR_DEPARTMENT_OTHER): Payer: Self-pay

## 2022-05-13 DIAGNOSIS — Z780 Asymptomatic menopausal state: Secondary | ICD-10-CM | POA: Insufficient documentation

## 2022-05-13 DIAGNOSIS — Z1231 Encounter for screening mammogram for malignant neoplasm of breast: Secondary | ICD-10-CM | POA: Insufficient documentation

## 2022-05-13 DIAGNOSIS — Z8249 Family history of ischemic heart disease and other diseases of the circulatory system: Secondary | ICD-10-CM | POA: Diagnosis not present

## 2022-05-14 ENCOUNTER — Telehealth: Payer: Self-pay | Admitting: Internal Medicine

## 2022-05-14 NOTE — Telephone Encounter (Signed)
-   Calcium coronary score 0.    - Small solid pulmonary nodules.  Patient is a smoker, recheck in 12 months. - Osteopenia: Recommend exercise, calcium and vitamin D.  Recheck DEXA in 2 years All of the above discussed with the patient over the phone. Although her calcium coronary score is 0, the LDL is 257.  We agreed to watch her diet closely and she will come back in January for a checkup.  Consider check a NMR rather than FLP.

## 2022-06-14 ENCOUNTER — Encounter: Payer: Self-pay | Admitting: Obstetrics & Gynecology

## 2022-06-14 ENCOUNTER — Other Ambulatory Visit (HOSPITAL_COMMUNITY)
Admission: RE | Admit: 2022-06-14 | Discharge: 2022-06-14 | Disposition: A | Discharge status: Home / Self Care | Payer: BC Managed Care – PPO | Source: Ambulatory Visit | Attending: Obstetrics & Gynecology | Admitting: Obstetrics & Gynecology

## 2022-06-14 ENCOUNTER — Ambulatory Visit (INDEPENDENT_AMBULATORY_CARE_PROVIDER_SITE_OTHER): Payer: BC Managed Care – PPO | Admitting: Obstetrics & Gynecology

## 2022-06-14 VITALS — BP 112/74 | HR 75 | Ht 66.25 in | Wt 192.0 lb

## 2022-06-14 DIAGNOSIS — Z90710 Acquired absence of both cervix and uterus: Secondary | ICD-10-CM | POA: Diagnosis not present

## 2022-06-14 DIAGNOSIS — Z1272 Encounter for screening for malignant neoplasm of vagina: Secondary | ICD-10-CM | POA: Insufficient documentation

## 2022-06-14 DIAGNOSIS — Z803 Family history of malignant neoplasm of breast: Secondary | ICD-10-CM

## 2022-06-14 DIAGNOSIS — Z01419 Encounter for gynecological examination (general) (routine) without abnormal findings: Secondary | ICD-10-CM | POA: Insufficient documentation

## 2022-06-14 DIAGNOSIS — N951 Menopausal and female climacteric states: Secondary | ICD-10-CM | POA: Diagnosis not present

## 2022-06-14 MED ORDER — VEOZAH 45 MG PO TABS: 1.0000 | ORAL_TABLET | Freq: Every day | ORAL | 4 refills | Status: DC

## 2022-06-14 NOTE — Progress Notes (Unsigned)
Jadeyn Hargett Cruise 10/01/1970 [id number]   History:    51 y.o. . G3P2A1L2 married.  Husband >20 yrs older with Lung Ca.  2 sons staying at home 24 and 85 yo.   RP:  New (>3 yrs) patient presenting for annual gyn exam    HPI: S/P LAVH in 2012.  Postmenopause, on no HRT with severe hot flushes and night sweats causing insomnia.  No pelvic pain.  No pain with IC.  Pap Neg 03/2019.  Breasts normal. Mammo Neg 13-Jun-2022.  Mother deceased from Breast Ca.  Patient is BrCa 1-2 negative.  BMI 30.76.  Walking her dog regularly. Bone Density normal 13-Jun-2022.  Health labs with Dr Larose Kells.  Flu vaccine declined.  Past medical history,surgical history, family history and social history were all reviewed and documented in the EPIC chart.  Gynecologic History No LMP recorded. Patient has had a hysterectomy.  Obstetric History OB History  Gravida Para Term Preterm AB Living  _0 SAB IAB Ectopic Multiple Live Births    1          # Outcome Date GA Lbr Len/2nd Weight Sex Delivery Anes PTL Lv  3 IAB           2 Term           1 Term              ROS: A ROS was performed and pertinent positives and negatives are included in the history. GENERAL: No fevers or chills. HEENT: No change in vision, no earache, sore throat or sinus congestion. NECK: No pain or stiffness. CARDIOVASCULAR: No chest pain or pressure. No palpitations. PULMONARY: No shortness of breath, cough or wheeze. GASTROINTESTINAL: No abdominal pain, nausea, vomiting or diarrhea, melena or bright red blood per rectum. GENITOURINARY: No urinary frequency, urgency, hesitancy or dysuria. MUSCULOSKELETAL: No joint or muscle pain, no back pain, no recent trauma. DERMATOLOGIC: No rash, no itching, no lesions. ENDOCRINE: No polyuria, polydipsia, no heat or cold intolerance. No recent change in weight. HEMATOLOGICAL: No anemia or easy bruising or bleeding. NEUROLOGIC: No headache, seizures, numbness, tingling or weakness. PSYCHIATRIC: No depression, no  loss of interest in normal activity or change in sleep pattern.     Exam:   BP 112/74   Pulse 75   Ht 5' 6.25" (1.683 m)   Wt 192 lb (87.1 kg)   SpO2 95%   BMI 30.76 kg/m   Body mass index is 30.76 kg/m.  General appearance : Well developed well nourished female. No acute distress HEENT: Eyes: no retinal hemorrhage or exudates,  Neck supple, trachea midline, no carotid bruits, no thyroidmegaly Lungs: Clear to auscultation, no rhonchi or wheezes, or rib retractions  Heart: Regular rate and rhythm, no murmurs or gallops Breast:Examined in sitting and supine position were symmetrical in appearance, no palpable masses or tenderness,  no skin retraction, no nipple inversion, no nipple discharge, no skin discoloration, no axillary or supraclavicular lymphadenopathy Abdomen: no palpable masses or tenderness, no rebound or guarding Extremities: no edema or skin discoloration or tenderness  Pelvic: Vulva: Normal             Vagina: No gross lesions or discharge.  Pap reflex done.  Cervix/Uterus absent  Adnexa  Without masses or tenderness  Anus: Normal   Assessment/Plan:  51 y.o. female for annual exam   1. Encounter for Papanicolaou smear of vagina as part of routine gynecological examination S/P LAVH in 2012.  Postmenopause, on no HRT with severe hot flushes and night sweats causing insomnia. No pelvic pain.  No pain with IC.  Pap Neg 03/2019.  Breasts normal. Mammo Neg 2022-06-04.  Mother deceased from Breast Ca.  Patient is BrCa 1-2 negative.  BMI 30.76.  Walking her dog regularly. Bone Density normal 06/04/2022.  Health labs with Dr Larose Kells.  Flu vaccine declined. - Cytology - PAP( [facility])  2. History of LAVH  3. Menopausal syndrome Start on [employer]. - Comp Met (CMET)  4. Vasomotor symptoms due to menopause Will start on Veozah.  5. Family history of breast cancer Mother deceased from Breast Ca.  Other orders - MAGNESIUM PO; Take by mouth. - Fezolinetant (VEOZAH) 45 MG TABS;  Take 1 tablet by mouth daily.   Princess Bruins MD, 2:21 PM 06/14/2022

## 2022-06-15 LAB — COMPREHENSIVE METABOLIC PANEL
AG Ratio: 1.5 (calc) (ref 1.0–2.5)
ALT: 14 U/L (ref 6–29)
AST: 14 U/L (ref 10–35)
Albumin: 4.6 g/dL (ref 3.6–5.1)
Alkaline phosphatase (APISO): 104 U/L (ref 37–153)
BUN/Creatinine Ratio: 11 (calc) (ref 6–22)
BUN: 12 mg/dL (ref 7–25)
CO2: 25 mmol/L (ref 20–32)
Calcium: 9.8 mg/dL (ref 8.6–10.4)
Chloride: 103 mmol/L (ref 98–110)
Creat: 1.05 mg/dL — ABNORMAL HIGH (ref 0.50–1.03)
Globulin: 3.1 g/dL (calc) (ref 1.9–3.7)
Glucose, Bld: 92 mg/dL (ref 65–99)
Potassium: 4 mmol/L (ref 3.5–5.3)
Sodium: 141 mmol/L (ref 135–146)
Total Bilirubin: 0.4 mg/dL (ref 0.2–1.2)
Total Protein: 7.7 g/dL (ref 6.1–8.1)

## 2022-06-17 ENCOUNTER — Encounter: Payer: Self-pay | Admitting: Obstetrics & Gynecology

## 2022-06-17 LAB — CYTOLOGY - PAP: Diagnosis: NEGATIVE

## 2022-06-29 ENCOUNTER — Other Ambulatory Visit: Payer: Self-pay | Admitting: Internal Medicine

## 2022-07-01 ENCOUNTER — Telehealth: Payer: Self-pay | Admitting: *Deleted

## 2022-07-01 NOTE — Telephone Encounter (Signed)
PA done via cover my med for Veozah 45 mg tablets,pending response from insurance.

## 2022-07-08 ENCOUNTER — Other Ambulatory Visit: Payer: Self-pay | Admitting: Internal Medicine

## 2022-09-26 ENCOUNTER — Other Ambulatory Visit: Payer: Self-pay | Admitting: Internal Medicine

## 2022-10-15 ENCOUNTER — Telehealth: Payer: Self-pay

## 2022-10-15 NOTE — Telephone Encounter (Signed)
Pt calling to report that initially the Willette Pa was working great for her vasomotor sxs of menopause. Started in 06/2022. However, reports in the last few days, she has noticed the hot flashes and night sweats have returned and intensified. Pt is inquiring if medication dose needs to be increased or what can be done for relief. Please advise.

## 2022-10-16 NOTE — Telephone Encounter (Signed)
Per ML: "Recommend to continue on Veozah, no option to increase the dose, observation at this time x 1 month."  Pt notified and voiced understanding. However, added that with the intensity that has increased with night sweats/hot flashes, there are times when she feels lightheaded an also is causing her some sleep disturbances in the night. Pt doesn't necessarily wish to continue with this medication if dose change is not an option. Are there any other recommendations for her at this time? Should we have her make an appt to discuss further? Please advise.

## 2022-10-17 NOTE — Telephone Encounter (Signed)
Melton Alar, CMA Left message for patient to call and schedule appointment.

## 2022-10-17 NOTE — Telephone Encounter (Signed)
Per ML: "Ok stop [employer].  Can schedule a visit next week.  Try Ashwagandha or Black Cohosh in the meantime."   Pt notified and voiced understanding. Will send msg to appt desk.

## 2022-10-20 ENCOUNTER — Other Ambulatory Visit: Payer: Self-pay | Admitting: Internal Medicine

## 2022-10-31 ENCOUNTER — Encounter: Payer: Self-pay | Admitting: Obstetrics & Gynecology

## 2022-11-05 NOTE — Telephone Encounter (Signed)
Per ML: "Yes to close."

## 2022-11-05 NOTE — Telephone Encounter (Signed)
Deborha Payment, Dondra Prader, CMA Left message on March 7 & 18 and mailed letter on March 21 for patient to call and schedule appointment; patient has not called back.  OK to close?

## 2022-11-18 ENCOUNTER — Other Ambulatory Visit: Payer: Self-pay | Admitting: Internal Medicine

## 2022-11-22 ENCOUNTER — Encounter: Payer: Self-pay | Admitting: Internal Medicine

## 2022-11-30 ENCOUNTER — Other Ambulatory Visit: Payer: Self-pay | Admitting: Internal Medicine

## 2023-04-25 ENCOUNTER — Other Ambulatory Visit (HOSPITAL_BASED_OUTPATIENT_CLINIC_OR_DEPARTMENT_OTHER): Payer: Self-pay | Admitting: Internal Medicine

## 2023-04-25 DIAGNOSIS — Z1231 Encounter for screening mammogram for malignant neoplasm of breast: Secondary | ICD-10-CM

## 2023-05-29 ENCOUNTER — Other Ambulatory Visit: Payer: Self-pay

## 2023-05-29 DIAGNOSIS — R911 Solitary pulmonary nodule: Secondary | ICD-10-CM

## 2023-05-29 NOTE — Addendum Note (Signed)
Addended by: Conrad Halsey D on: 05/29/2023 10:02 AM   Modules accepted: Orders

## 2023-06-02 ENCOUNTER — Telehealth: Payer: Self-pay | Admitting: Internal Medicine

## 2023-06-02 MED ORDER — METOPROLOL SUCCINATE ER 50 MG PO TB24: 50.0000 mg | ORAL_TABLET | Freq: Every day | ORAL | 0 refills | Status: DC

## 2023-06-02 NOTE — Addendum Note (Signed)
Addended byConrad Tavernier D on: 06/02/2023 01:03 PM   Modules accepted: Orders

## 2023-06-02 NOTE — Telephone Encounter (Signed)
Prescription Request  06/02/2023  Is this a "Controlled Substance" medicine? No  LOV: Visit date not found  What is the name of the medication or equipment? metoprolol succinate (TOPROL-XL) 50 MG 24 hr tablet  Have you contacted your pharmacy to request a refill? Yes   Which pharmacy would you like this sent to?   [facility]   [address] [place], [place] [address] Pharmacy: Open ? Closes 1:30?PM ? Reopens 2?PM  More hours Phone: [phone]  Patient notified that their request is being sent to the clinical staff for review and that they should receive a response within 2 business days.   Please advise at [facility] [phone]

## 2023-06-02 NOTE — Telephone Encounter (Signed)
Rx sent. Pt due for follow-up w/ Dr Drue Novel in December. Please schedule at her convenience. Thank you.

## 2023-06-04 ENCOUNTER — Telehealth: Payer: Self-pay | Admitting: Internal Medicine

## 2023-06-04 NOTE — Telephone Encounter (Signed)
Pharmacy updated.

## 2023-06-04 NOTE — Telephone Encounter (Signed)
Pt called stating that her medication was sent to the wrong pharmacy. Pt is ok with picking it up this time but will need to have all medications going forward routed to the following pharmacy:  [facility] [address] [place], [place], [place] [address] P: [phone]  Pt would also like to have the current [facility] location removed off her preferred pharmacies list.

## 2023-06-05 ENCOUNTER — Other Ambulatory Visit: Payer: Self-pay | Admitting: Internal Medicine

## 2023-06-19 ENCOUNTER — Inpatient Hospital Stay (HOSPITAL_BASED_OUTPATIENT_CLINIC_OR_DEPARTMENT_OTHER): Admission: RE | Admit: 2023-06-19 | Payer: BC Managed Care – PPO | Source: Ambulatory Visit

## 2023-06-20 ENCOUNTER — Ambulatory Visit (INDEPENDENT_AMBULATORY_CARE_PROVIDER_SITE_OTHER): Payer: BC Managed Care – PPO | Admitting: Obstetrics and Gynecology

## 2023-06-20 ENCOUNTER — Encounter: Payer: Self-pay | Admitting: Obstetrics and Gynecology

## 2023-06-20 VITALS — BP 122/80 | HR 84 | Ht 66.5 in | Wt 199.0 lb

## 2023-06-20 DIAGNOSIS — B009 Herpesviral infection, unspecified: Secondary | ICD-10-CM

## 2023-06-20 DIAGNOSIS — N951 Menopausal and female climacteric states: Secondary | ICD-10-CM

## 2023-06-20 DIAGNOSIS — Z01419 Encounter for gynecological examination (general) (routine) without abnormal findings: Secondary | ICD-10-CM | POA: Diagnosis not present

## 2023-06-20 DIAGNOSIS — Z1211 Encounter for screening for malignant neoplasm of colon: Secondary | ICD-10-CM

## 2023-06-20 DIAGNOSIS — E2839 Other primary ovarian failure: Secondary | ICD-10-CM

## 2023-06-20 MED ORDER — VALACYCLOVIR HCL 500 MG PO TABS: 500.0000 mg | ORAL_TABLET | Freq: Two times a day (BID) | ORAL | 6 refills | Status: DC

## 2023-06-20 MED ORDER — INTRAROSA 6.5 MG VA INST: 1.0000 | VAGINAL_INSERT | Freq: Every evening | VAGINAL | 12 refills | Status: DC | PRN

## 2023-06-20 MED ORDER — ESTRADIOL 0.05 MG/24HR TD PTWK: 0.0500 mg | MEDICATED_PATCH | TRANSDERMAL | 12 refills | Status: DC

## 2023-06-20 NOTE — Patient Instructions (Addendum)
I put in a bone scan as well to get a baseline due to past history of smoking and lack of estrogen.  Radiology will call you.

## 2023-06-20 NOTE — Progress Notes (Signed)
History:    52 y.o. G3P2A1L2 married.  Husband >20 yrs older with Lung Ca/COPD.  2 sons staying at home 38 and 10 yo.   RP:  patient presenting for annual gyn exam    HPI: S/P [facility] in 2012 for menorrhagia.  Postmenopause, on no HRT with severe hot flushes and night sweats causing insomnia.  was on veozzah and provider stopped. No pelvic pain.  No pain with IC.  Pap Neg 03/2019.  Breasts normal. Mammo Neg 06-27-2022.  Mother deceased from Breast Ca.  Patient is BrCa 1-2 negative. Body mass index is 31.64 kg/m. .  Walking her dog regularly. Bone Density normal June 27, 2022.  Health labs with Dr Drue Novel.  Flu vaccine declined.  Struggles with her weight gain. In tears today and would like to start something for the hot flashes and mood swings. Colonoscopy: needs to schedule.  Referral placed  Blood pressure 122/80, pulse 84, height 5' 6.5" (1.689 m), weight 199 lb (90.3 kg), SpO2 97%.     Component Value Date/Time   DIAGPAP  06/14/2022 1447    - Negative for intraepithelial lesion or malignancy (NILM)   ADEQPAP Satisfactory for evaluation. 06/14/2022 1447    GYN HISTORY:    Component Value Date/Time   DIAGPAP  06/14/2022 1447    - Negative for intraepithelial lesion or malignancy (NILM)   ADEQPAP Satisfactory for evaluation. 06/14/2022 1447    OB History  Gravida Para Term Preterm AB Living  3 2 2   1 2   SAB IAB Ectopic Multiple Live Births    1          # Outcome Date GA Lbr Len/2nd Weight Sex Type Anes PTL Lv  3 IAB           2 Term           1 Term             Past Medical History:  Diagnosis Date   Abnormal vaginal Pap smear    Abortion history    x1   Anxiety    BRCA1 negative 11/12/10   NEGATIVE MUTATION   BRCA2 negative 11/12/10   NEGATIVE MUTATION   Chronic cystitis    Herpes simplex    HSV   Hypertension    Miscarriage    x1    Nephrolithiasis 1999   rt kidney functions @ 40%, sees urology routinely (Dr.Peterson), kidney stones, recurrent   Tobacco abuse      Past Surgical History:  Procedure Laterality Date   CESAREAN SECTION     X 2 W BTSP   ENDOMETRIAL ABLATION     LAPAROSCOPIC TOTAL HYSTERECTOMY  01/31/2011   TLH, B oophorectomy   TONSILLECTOMY     TUBAL LIGATION      Current Outpatient Medications on File Prior to Visit  Medication Sig Dispense Refill   aspirin 81 MG tablet Take 81 mg by mouth daily.     fish oil-omega-3 fatty acids 1000 MG capsule Take 2 g by mouth daily.     MAGNESIUM PO Take by mouth.     metoprolol succinate (TOPROL-XL) 50 MG 24 hr tablet Take 1 tablet (50 mg total) by mouth daily. TAKE WITH OR IMMEDIATELY FOLLOWING A MEAL. 90 tablet 0   Multiple Vitamin (MULTIVITAMIN) capsule Take 1 capsule by mouth daily.     valACYclovir (VALTREX) 500 MG tablet Take 1 tablet (500 mg total) by mouth daily as needed. 30 tablet 6   Vitamin D, Ergocalciferol, (DRISDOL)  1.25 MG (50000 UNIT) CAPS capsule Take 1 capsule (50,000 Units total) by mouth every 7 (seven) days. 12 capsule 0   No current facility-administered medications on file prior to visit.    Social History   Socioeconomic History   Marital status: Married    Spouse name: Not on file   Number of children: 2   Years of education: Not on file   Highest education level: Not on file  Occupational History   Occupation: Print production planner  Tobacco Use   Smoking status: Former    Current packs/day: 0.00    Types: Cigarettes    Quit date: 01/08/2022    Years since quitting: 1.4   Smokeless tobacco: Never   Tobacco comments:    1 PPD, quit ~ 12-2021  Vaping Use   Vaping status: Never Used  Substance and Sexual Activity   Alcohol use: Not Currently   Drug use: Yes    Comment: cbd gummy   Sexual activity: Not Currently    Partners: Male    Birth control/protection: Surgical    Comment: hysterectomy  Other Topics Concern   Not on file  Social History Narrative   Lost mom 2014   Lost father Dorene Sorrow 3/11   Lives locally with husband and 2 sons. (1995, 16)    Husband has a daughter (from before they married)         Social Determinants of Corporate investment banker Strain: Not on file  Food Insecurity: Not on file  Transportation Needs: Not on file  Physical Activity: Not on file  Stress: Not on file  Social Connections: Not on file  Intimate Partner Violence: Not on file    Family History  Problem Relation Age of Onset   Diabetes Mother    Breast cancer Mother        deceased.   Hypertension Mother    Heart attack Mother        First MI @ 29, CABG in early 57's.   Diabetes Father    Hypertension Father    Heart attack Father        First MI @ 67 - total of 5, deceased.   Breast cancer Maternal Grandmother      Allergies  Allergen Reactions   Ciprofloxacin Anaphylaxis and Nausea And Vomiting   Codeine Anaphylaxis and Nausea And Vomiting   Demerol Anaphylaxis   Levofloxacin Anaphylaxis   Nortriptyline Hcl Anaphylaxis    REACTION: irritability--"anger"   Ultram [Tramadol Hcl] Anaphylaxis   Prednisone Nausea And Vomiting      Patient's last menstrual period was No LMP recorded. Patient has had a hysterectomy.Marland Kitchen          Sexually active: no     Review of Systems Alls systems reviewed and are negative.     OBGyn Exam    A:         Well Woman GYN exam, vasomotor symptoms, depression, HSV2, h/o SCH ovaries intact                             P:        Pap smear not indicated no abnormal.  Has cervix.  Repeat Q3 years Encouraged annual mammogram screening Colon cancer screening referral placed today DXA ordered today baseline with longstanding smoking.  Has quit for over a year and was praised for this. HTN: well controlled Labs and immunizations to do with PMD Discussed breast self exams Encouraged  healthy lifestyle practices Encouraged Vit D and Calcium  Counseled on the r/b/a/I of HRT use. She does not have a contraindication for starting estrogen with normal mammograms and BRCA neg.  Discussed risk with  combined estrogen/progesterone use of 1/100, however, she does not need a combined dose. Discussed options with return to veozzah or estrogen patch.   Discussed lower risk for DVT and stroke with the patch.  She will need to monitor her bp on the patch and continue with annual mammograms. Side effects include risk of breast tenderness and spotting along with low risk of blood clots and stroke with uncontrolled hypertension. Counseled on the benefits to help improve the bone density during menopause, relief of hot flashes and improved emotional balance.  We discussed starting effexor as well in couple weeks, if she needs this as well. Intrarosa sent for natural support of testosterone level in women, but insurance usually does not cover.  15 minutes spent on reviewing records, imaging,  and one on one patient time and counseling patient and documentation Dr. Karma Greaser    No follow-ups on file.  Earley Favor

## 2023-07-07 ENCOUNTER — Ambulatory Visit (HOSPITAL_BASED_OUTPATIENT_CLINIC_OR_DEPARTMENT_OTHER)
Admission: RE | Admit: 2023-07-07 | Discharge: 2023-07-07 | Disposition: A | Discharge status: Home / Self Care | Payer: BC Managed Care – PPO | Source: Ambulatory Visit | Attending: Internal Medicine | Admitting: Internal Medicine

## 2023-07-07 ENCOUNTER — Encounter (HOSPITAL_BASED_OUTPATIENT_CLINIC_OR_DEPARTMENT_OTHER): Payer: Self-pay

## 2023-07-07 DIAGNOSIS — Z1231 Encounter for screening mammogram for malignant neoplasm of breast: Secondary | ICD-10-CM | POA: Diagnosis not present

## 2023-07-17 ENCOUNTER — Encounter: Payer: Self-pay | Admitting: Obstetrics and Gynecology

## 2023-07-17 ENCOUNTER — Ambulatory Visit (INDEPENDENT_AMBULATORY_CARE_PROVIDER_SITE_OTHER): Payer: BC Managed Care – PPO | Admitting: Obstetrics and Gynecology

## 2023-07-17 VITALS — BP 110/70 | Wt 193.0 lb

## 2023-07-17 DIAGNOSIS — F320 Major depressive disorder, single episode, mild: Secondary | ICD-10-CM | POA: Diagnosis not present

## 2023-07-17 DIAGNOSIS — R002 Palpitations: Secondary | ICD-10-CM

## 2023-07-17 MED ORDER — VENLAFAXINE HCL ER 37.5 MG PO CP24: 37.5000 mg | ORAL_CAPSULE | Freq: Every day | ORAL | 12 refills | Status: DC

## 2023-07-17 MED ORDER — ESTRADIOL 0.1 MG/24HR TD PTTW: 1.0000 | MEDICATED_PATCH | TRANSDERMAL | 12 refills | Status: DC

## 2023-07-17 NOTE — Progress Notes (Signed)
Patient  seen for follow-up after starting vivelle patch 0.05 She has had a hysterectomy and after that has not felt emotionally well since. The hot flashes are very bothersome to her and she states she did not feel any relief. She is having embarrassing hot flashes Q 2-3 hours, insomnia (waking up at 2 am) and only sleeping about 2-3 hours a night.  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 She has tried veozzah in the past and this worked for 4 weeks then it stopped working and she was stopped from the medication. She does report some suicidal thoughts on an antidepressant she tried years ago. No current h/s ideations, but would like to feel better soon Does report some palpations as well  Blood pressure 110/70, weight 193 lb (87.5 kg).  A/p vasomotor symptoms, situational depression  Increase estrogen patch tom .1mg  vivelle twice weekly.  She does not have a uterus and does not require progesterone at this time.  However, we did discuss consider starting this for the insomnia. To begin effexor low dose 37.5 XR at bedtime.  Counseled on r/b/a/I and side effects with stopping.  She will try this.  Referral placed to psychiatry as well for counseling and medication management.   Rtc in one week for follow-up or sooner with any concerns  30 minutes spent on reviewing records, imaging,  and one on one patient time and counseling patient and documentation Dr. Karma Greaser

## 2023-07-25 ENCOUNTER — Ambulatory Visit (INDEPENDENT_AMBULATORY_CARE_PROVIDER_SITE_OTHER): Payer: BC Managed Care – PPO | Admitting: Obstetrics and Gynecology

## 2023-07-25 ENCOUNTER — Encounter: Payer: Self-pay | Admitting: Obstetrics and Gynecology

## 2023-07-25 VITALS — BP 124/76 | HR 82 | Ht 66.5 in | Wt 193.0 lb

## 2023-07-25 DIAGNOSIS — Z1211 Encounter for screening for malignant neoplasm of colon: Secondary | ICD-10-CM

## 2023-07-25 DIAGNOSIS — Z79890 Hormone replacement therapy (postmenopausal): Secondary | ICD-10-CM | POA: Diagnosis not present

## 2023-07-25 DIAGNOSIS — Z1231 Encounter for screening mammogram for malignant neoplasm of breast: Secondary | ICD-10-CM

## 2023-07-25 DIAGNOSIS — Z01419 Encounter for gynecological examination (general) (routine) without abnormal findings: Secondary | ICD-10-CM

## 2023-07-25 DIAGNOSIS — Z79899 Other long term (current) drug therapy: Secondary | ICD-10-CM

## 2023-07-25 MED ORDER — PROGESTERONE MICRONIZED 100 MG PO CAPS: 100.0000 mg | ORAL_CAPSULE | Freq: Every day | ORAL | 3 refills | Status: DC

## 2023-07-25 NOTE — Progress Notes (Signed)
52 y.o. y.o. female here for follow-up after starting HRT and effexor No LMP recorded. Patient has had a hysterectomy.    3P2A1L2 married.  Husband >20 yrs older with Lung Ca/COPD.  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 in 2012 for menorrhagia.  Postmenopause, on no HRT with severe hot flushes and night sweats causing insomnia.  was on veozzah and provider stopped. No pelvic pain.  No pain with IC.  Pap Neg 03/2019.  Breasts normal. Mammo Neg 2022-06-19.  Mother deceased from Breast Ca.  Patient is BrCa 1-2 negative. Body mass index is 31.64 kg/m. .  Walking her dog regularly. Bone Density normal June 19, 2022.  Health labs with Dr Drue Novel.  Flu vaccine declined. Last visit: Patient  seen for follow-up after starting vivelle patch 0.05 She has had a hysterectomy and after that has not felt emotionally well since. The hot flashes are very bothersome to her and she states she did not feel any relief. She is having embarrassing hot flashes Q 2-3 hours, insomnia (waking up at 2 am) and only sleeping about 2-3 hours a night.  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 She has tried veozzah in the past and this worked for 4 weeks then it stopped working and she was stopped from the medication. She does report some suicidal thoughts on an antidepressant she tried years ago. No current h/s ideations, but would like to feel better soon Does report some palpations as well  Today she is doing much better.  She is still waking up at about 4-8AM with hot flashes and having less severe ones throughout the day. Palpitations have resolved.  Was a bit confused on what to take for the patches and used 0.1 and the .05 together for last week and placed same yesterday Mild frontal headache in am that resolves later in the day.  Husband has noticed a difference. Body mass index is 30.68 kg/m.  Blood pressure 124/76, pulse 82, height 5' 6.5"  (1.689 m), weight 193 lb (87.5 kg), SpO2 98%.      04/24/2022    2:06 PM 12/11/2020    3:05 PM 02/10/2019    3:10 PM  Depression screen PHQ 2/9  Decreased Interest 0 0 0  Down, Depressed, Hopeless 0 0 0  PHQ - 2 Score 0 0 0        Component Value Date/Time   DIAGPAP  06/14/2022 1447    - Negative for intraepithelial lesion or malignancy (NILM)   ADEQPAP Satisfactory for evaluation. 06/14/2022 1447    GYN HISTORY:    Component Value Date/Time   DIAGPAP  06/14/2022 1447    - Negative for intraepithelial lesion or malignancy (NILM)   ADEQPAP Satisfactory for evaluation. 06/14/2022 1447    OB History  Gravida Para Term Preterm AB Living  3 2 2  1 2   SAB IAB Ectopic Multiple Live Births   1       # Outcome Date GA Lbr Len/2nd Weight Sex Type Anes PTL Lv  3 IAB           2 Term           1 Term             Past Medical History:  Diagnosis Date   Abnormal vaginal Pap smear    Abortion history    x1  Anxiety    BRCA1 negative 11/12/10   NEGATIVE MUTATION   BRCA2 negative 11/12/10   NEGATIVE MUTATION   Chronic cystitis    Herpes simplex    HSV   Hypertension    Miscarriage    x1    Nephrolithiasis 1999   rt kidney functions @ 40%, sees urology routinely (Dr.Peterson), kidney stones, recurrent   Tobacco abuse     Past Surgical History:  Procedure Laterality Date   CESAREAN SECTION     X 2 W BTSP   ENDOMETRIAL ABLATION     LAPAROSCOPIC TOTAL HYSTERECTOMY  01/31/2011   TLH, B oophorectomy   TONSILLECTOMY     TUBAL LIGATION      Current Outpatient Medications on File Prior to Visit  Medication Sig Dispense Refill   aspirin 81 MG tablet Take 81 mg by mouth daily.     estradiol (VIVELLE-DOT) 0.1 MG/24HR patch Place 1 patch (0.1 mg total) onto the skin 2 (two) times a week. 8 patch 12   fish oil-omega-3 fatty acids 1000 MG capsule Take 2 g by mouth daily.     MAGNESIUM PO Take by mouth.     metoprolol succinate (TOPROL-XL) 50 MG 24 hr tablet Take 1 tablet  (50 mg total) by mouth daily. TAKE WITH OR IMMEDIATELY FOLLOWING A MEAL. 90 tablet 0   Multiple Vitamin (MULTIVITAMIN) capsule Take 1 capsule by mouth daily.     Prasterone (INTRAROSA) 6.5 MG INST Place 1 suppository vaginally at bedtime as needed. 30 each 12   valACYclovir (VALTREX) 500 MG tablet Take 1 tablet (500 mg total) by mouth daily as needed. 30 tablet 6   valACYclovir (VALTREX) 500 MG tablet Take 1 tablet (500 mg total) by mouth 2 (two) times daily. 60 tablet 6   venlafaxine XR (EFFEXOR XR) 37.5 MG 24 hr capsule Take 1 capsule (37.5 mg total) by mouth at bedtime. 30 capsule 12   VITAMIN D PO Take by mouth.     No current facility-administered medications on file prior to visit.    Social History   Socioeconomic History   Marital status: Married    Spouse name: Not on file   Number of children: 2   Years of education: Not on file   Highest education level: Not on file  Occupational History   Occupation: Print production planner  Tobacco Use   Smoking status: Former    Current packs/day: 0.00    Types: Cigarettes    Quit date: 01/08/2022    Years since quitting: 1.5   Smokeless tobacco: Never   Tobacco comments:    1 PPD, quit ~ 12-2021  Vaping Use   Vaping status: Never Used  Substance and Sexual Activity   Alcohol use: Not Currently   Drug use: Yes    Comment: cbd gummy   Sexual activity: Not Currently    Partners: Male    Birth control/protection: Surgical    Comment: hysterectomy  Other Topics Concern   Not on file  Social History Narrative   Lost mom 2014   Lost father Dorene Sorrow 3/11   Lives locally with husband and 2 sons. (1995, 60)   Husband has a daughter (from before they married)         Social Drivers of Corporate investment banker Strain: Not on file  Food Insecurity: Not on file  Transportation Needs: Not on file  Physical Activity: Not on file  Stress: Not on file  Social Connections: Not on file  Intimate Partner  Violence: Not on file    Family  History  Problem Relation Age of Onset   Diabetes Mother    Breast cancer Mother        deceased.   Hypertension Mother    Heart attack Mother        First MI @ 57, CABG in early 48's.   Diabetes Father    Hypertension Father    Heart attack Father        First MI @ 21 - total of 5, deceased.   Breast cancer Maternal Grandmother      Allergies  Allergen Reactions   Ciprofloxacin Anaphylaxis and Nausea And Vomiting   Codeine Anaphylaxis and Nausea And Vomiting   Demerol Anaphylaxis   Levofloxacin Anaphylaxis   Nortriptyline Hcl Anaphylaxis    REACTION: irritability--"anger"   Ultram [Tramadol Hcl] Anaphylaxis   Prednisone Nausea And Vomiting      Patient's last menstrual period was No LMP recorded. Patient has had a hysterectomy..              A/P HRT follow-up Take only vivelle 0.1 vivelle twice weekly. Remove and place new patch on Thursday and Sundays.  Begin prometrium at bedtime to see if this helps with sleep and hot flashes during the night.  Stay on current dose of effexor.  Can increase this later. RTC in 2 weeks or sooner with any concerns.  No follow-ups on file. 20 minutes spent on reviewing records, imaging,  and one on one patient time and counseling patient and documentation Dr. Judith Blonder

## 2023-07-25 NOTE — Patient Instructions (Signed)
Take only vivelle 0.1 vivelle patch twice weekly. Remove and place new patch on Thursday and Sundays.  Begin prometrium at bedtime to see if this helps with sleep and hot flashes during the night.  Stay on current dose of effexor.  Can increase this later. RTC in 2 weeks or sooner with any concerns.

## 2023-09-11 ENCOUNTER — Other Ambulatory Visit: Payer: Self-pay | Admitting: Internal Medicine

## 2023-09-11 MED ORDER — METOPROLOL SUCCINATE ER 50 MG PO TB24: 50.0000 mg | ORAL_TABLET | Freq: Every day | ORAL | 0 refills | Status: DC

## 2023-09-11 NOTE — Telephone Encounter (Signed)
Copied from CRM [id number]. Topic: Clinical - Medication Refill >> Sep 11, 2023  8:27 AM Prudencio Pair wrote: Most Recent Primary Care Visit:  Provider: Willow Ora E  Department: [facility]  Visit Type: PHYSICAL  Date: 04/24/2022  Medication: metoprolol succinate (TOPROL-XL) 50 MG 24 hr tablet  Has the patient contacted their pharmacy? Yes, was not able to speak with anyone but has switched pharmacies. (Agent: If no, request that the patient contact the pharmacy for the refill. If patient does not wish to contact the pharmacy document the reason why and proceed with request.) (Agent: If yes, when and what did the pharmacy advise?)  Is this the correct pharmacy for this prescription? Yes If no, delete pharmacy and type the correct one.  This is the patient's preferred pharmacy:  [id number] [place], [place] - [address] AT [facility] [address] OF [address] & [facility] [address] [place] [address] Phone: [phone] Fax: [phone]   Has the prescription been filled recently? Yes  Is the patient out of the medication? Yes  Has the patient been seen for an appointment in the last year OR does the patient have an upcoming appointment? Yes  Can we respond through MyChart? Yes  Agent: Please be advised that Rx refills may take up to 3 business days. We ask that you follow-up with your pharmacy.

## 2023-09-13 ENCOUNTER — Other Ambulatory Visit: Payer: Self-pay | Admitting: Internal Medicine

## 2023-10-16 ENCOUNTER — Other Ambulatory Visit: Payer: Self-pay | Admitting: Internal Medicine

## 2023-10-16 MED ORDER — METOPROLOL SUCCINATE ER 50 MG PO TB24: 50.0000 mg | ORAL_TABLET | Freq: Every day | ORAL | 0 refills | Status: DC

## 2023-10-17 ENCOUNTER — Other Ambulatory Visit: Payer: Self-pay | Admitting: Internal Medicine

## 2023-10-29 ENCOUNTER — Other Ambulatory Visit: Payer: Self-pay | Admitting: Internal Medicine

## 2023-10-29 ENCOUNTER — Telehealth: Payer: Self-pay | Admitting: Emergency Medicine

## 2023-10-29 MED ORDER — METOPROLOL SUCCINATE ER 50 MG PO TB24: 50.0000 mg | ORAL_TABLET | Freq: Every day | ORAL | 0 refills | Status: DC

## 2023-10-29 NOTE — Addendum Note (Signed)
 Addended by: Thelma Barge D on: 10/29/2023 03:10 PM   Modules accepted: Orders

## 2023-10-29 NOTE — Telephone Encounter (Signed)
 Rx sent in and patient notified.

## 2023-10-29 NOTE — Telephone Encounter (Signed)
 Copied from CRM [id number]. Topic: Clinical - Medication Question >> Oct 29, 2023 10:51 AM Almira Coaster wrote: Reason for CRM: Patient received a refill for metoprolol succinate (TOPROL-XL) 50 MG 24 hr tablet but only got 15 pills, she scheduled an appointment to see Dr.Paz on 12/08/2023 and would like to know if she can get a refill to hold her over until then.

## 2023-10-31 ENCOUNTER — Other Ambulatory Visit: Payer: Self-pay | Admitting: Internal Medicine

## 2023-12-08 ENCOUNTER — Ambulatory Visit (INDEPENDENT_AMBULATORY_CARE_PROVIDER_SITE_OTHER): Admitting: Internal Medicine

## 2023-12-08 ENCOUNTER — Encounter: Payer: Self-pay | Admitting: Internal Medicine

## 2023-12-08 VITALS — BP 122/76 | HR 66 | Temp 98.1°F | Resp 16 | Ht 66.5 in | Wt 191.1 lb

## 2023-12-08 DIAGNOSIS — I10 Essential (primary) hypertension: Secondary | ICD-10-CM | POA: Diagnosis not present

## 2023-12-08 DIAGNOSIS — E785 Hyperlipidemia, unspecified: Secondary | ICD-10-CM | POA: Diagnosis not present

## 2023-12-08 DIAGNOSIS — R918 Other nonspecific abnormal finding of lung field: Secondary | ICD-10-CM

## 2023-12-08 DIAGNOSIS — Z87891 Personal history of nicotine dependence: Secondary | ICD-10-CM

## 2023-12-08 DIAGNOSIS — E038 Other specified hypothyroidism: Secondary | ICD-10-CM

## 2023-12-08 DIAGNOSIS — R739 Hyperglycemia, unspecified: Secondary | ICD-10-CM

## 2023-12-08 LAB — LIPID PANEL
Cholesterol: 339 mg/dL — ABNORMAL HIGH (ref 0–200)
HDL: 44.5 mg/dL (ref >39.00)
LDL Cholesterol: 234 mg/dL — ABNORMAL HIGH (ref 0–99)
NonHDL: 294.8
Total CHOL/HDL Ratio: 8
Triglycerides: 305 mg/dL — ABNORMAL HIGH (ref 0.0–149.0)
VLDL: 61 mg/dL — ABNORMAL HIGH (ref 0.0–40.0)

## 2023-12-08 LAB — COMPREHENSIVE METABOLIC PANEL WITH GFR
ALT: 18 U/L (ref 0–35)
AST: 15 U/L (ref 0–37)
Albumin: 4.7 g/dL (ref 3.5–5.2)
Alkaline Phosphatase: 107 U/L (ref 39–117)
BUN: 11 mg/dL (ref 6–23)
CO2: 27 meq/L (ref 19–32)
Calcium: 10.6 mg/dL — ABNORMAL HIGH (ref 8.4–10.5)
Chloride: 103 meq/L (ref 96–112)
Creatinine, Ser: 0.87 mg/dL (ref 0.40–1.20)
GFR: 76.5 mL/min (ref >60.00)
Glucose, Bld: 106 mg/dL — ABNORMAL HIGH (ref 70–99)
Potassium: 4.4 meq/L (ref 3.5–5.1)
Sodium: 140 meq/L (ref 135–145)
Total Bilirubin: 0.5 mg/dL (ref 0.2–1.2)
Total Protein: 7.6 g/dL (ref 6.0–8.3)

## 2023-12-08 LAB — T4, FREE: Free T4: 0.7 ng/dL (ref 0.60–1.60)

## 2023-12-08 LAB — CBC WITH DIFFERENTIAL/PLATELET
Basophils Absolute: 0.1 10*3/uL (ref 0.0–0.1)
Basophils Relative: 0.8 % (ref 0.0–3.0)
Eosinophils Absolute: 0.1 10*3/uL (ref 0.0–0.7)
Eosinophils Relative: 1.2 % (ref 0.0–5.0)
HCT: 47.7 % — ABNORMAL HIGH (ref 36.0–46.0)
Hemoglobin: 15.9 g/dL — ABNORMAL HIGH (ref 12.0–15.0)
Lymphocytes Relative: 27.6 % (ref 12.0–46.0)
Lymphs Abs: 2.5 10*3/uL (ref 0.7–4.0)
MCHC: 33.3 g/dL (ref 30.0–36.0)
MCV: 90.4 fl (ref 78.0–100.0)
Monocytes Absolute: 0.7 10*3/uL (ref 0.1–1.0)
Monocytes Relative: 7.4 % (ref 3.0–12.0)
Neutro Abs: 5.7 10*3/uL (ref 1.4–7.7)
Neutrophils Relative %: 63 % (ref 43.0–77.0)
Platelets: 274 10*3/uL (ref 150.0–400.0)
RBC: 5.28 Mil/uL — ABNORMAL HIGH (ref 3.87–5.11)
RDW: 13.3 % (ref 11.5–15.5)
WBC: 9 10*3/uL (ref 4.0–10.5)

## 2023-12-08 LAB — TSH: TSH: 2.28 u[IU]/mL (ref 0.35–5.50)

## 2023-12-08 LAB — HEMOGLOBIN A1C: Hgb A1c MFr Bld: 5.7 % (ref 4.6–6.5)

## 2023-12-08 MED ORDER — METOPROLOL SUCCINATE ER 50 MG PO TB24: 50.0000 mg | ORAL_TABLET | Freq: Every day | ORAL | 4 refills | Status: DC

## 2023-12-08 NOTE — Patient Instructions (Addendum)
 INSTRUCTIONS  FOR TODAY  We are referring you to a CT of the chest to be done in [place].  If they do not reach out to you in the next few days please let me know  GO TO THE LAB : Get the blood work     Next office visit for a physical exam in 4 months Please make an appointment before you leave today

## 2023-12-08 NOTE — Assessment & Plan Note (Signed)
 Last seen 04/2022. Hyperglycemia: Check A1c. HTN- On metoprolol , no ambulatory BPs, BP today looks good, refill metoprolol , CMP and CBC. Dyslipidemia, cardiovascular risk:  -Last LDL 257.  Has been reluctant to take statins. - coronary calcium score 0 (October 2023) -heavy smoker,quit 2 years ago, -Plan: Check FLP, today states that she would be willing to take a statin if needed. Small solid pulmonary nodule, patient was a heavy smoker, quit 2 years ago.  Check CT chest for a follow-up Osteopenia: T-score 05/13/2022: -2.1, Rx vitamin D  exercise.  Next  dexa 05/2024 Elevated TSH.  History of, check TFTs. Anxiety: Was very anxious, gynecology prescribed Effexor , doing much better. RTC 4 months CPX

## 2023-12-08 NOTE — Progress Notes (Signed)
 Subjective:    Patient ID: [name], female    DOB: 1970-09-25, 53 y.o.   MRN: [id number]  DOS:  12/08/2023 Type of visit - description: Routine checkup.  In general feels well. No ambulatory BPs. Had anxiety, saw gynecology, doing better.  Denies chest pain or difficulty breathing No cough No nausea, vomiting, diarrhea.  Review of Systems See above   Past Medical History:  Diagnosis Date   Abnormal vaginal Pap smear    Abortion history    x1   Anxiety    BRCA1 negative 11/12/10   NEGATIVE MUTATION   BRCA2 negative 11/12/10   NEGATIVE MUTATION   Chronic cystitis    Herpes simplex    HSV   Hypertension    Miscarriage    x1    Nephrolithiasis 1999   rt kidney functions @ 40%, sees urology routinely (Dr.Peterson), kidney stones, recurrent   Tobacco abuse     Past Surgical History:  Procedure Laterality Date   CESAREAN SECTION     X 2 W BTSP   ENDOMETRIAL ABLATION     LAPAROSCOPIC TOTAL HYSTERECTOMY  01/31/2011   TLH, B oophorectomy   TONSILLECTOMY     TUBAL LIGATION      Current Outpatient Medications  Medication Instructions   aspirin 81 mg, Daily   estradiol  (VIVELLE -DOT) 0.1 mg, Transdermal, 2 times weekly   fish oil-omega-3 fatty acids 2 g, Daily   MAGNESIUM PO Take by mouth.   metoprolol  succinate (TOPROL -XL) 50 mg, Oral, Daily   Multiple Vitamin (MULTIVITAMIN) capsule 1 capsule, Daily   progesterone  (PROMETRIUM ) 100 mg, Oral, Daily at bedtime   valACYclovir  (VALTREX ) 500 mg, Oral, 2 times daily   venlafaxine  XR (EFFEXOR  XR) 37.5 mg, Oral, Daily at bedtime   VITAMIN D  PO Take by mouth.       Objective:   Physical Exam BP 122/76   Pulse 66   Temp 98.1 F (36.7 C) (Oral)   Resp 16   Ht 5' 6.5" (1.689 m)   Wt 191 lb 2 oz (86.7 kg)   SpO2 97%   BMI 30.39 kg/m  General:   Well developed, NAD, BMI noted. HEENT:  Normocephalic . Face symmetric, atraumatic Lungs:  CTA B Normal respiratory effort, no intercostal retractions, no  accessory muscle use. Heart: RRR,  no murmur.  Lower extremities: no pretibial edema bilaterally  Skin: Not pale. Not jaundice Neurologic:  alert & oriented X3.  Speech normal, gait appropriate for age and unassisted Psych--  Cognition and judgment appear intact.  Cooperative with normal attention span and concentration.  Behavior appropriate. No anxious or depressed appearing.      Assessment   Assessment Hyperglycemia A1c 5.9 HTN Anxiety Tobacco abuse-- tried Wellbutrin years ago "crazy thoughts" Chronic cystitis Kidney stones Hysterectomy ~ 2012, h/o abnormal PAPs   Herpes genital, valtrex  prn BRCA 1-2 neg Vit D def (dx 2018, did not tolerate well ergocalciferol )  Coronary calcium score 0 (05/2022 Pulmonary nodule: Per CT 05/2022  PLAN  Last seen 04/2022. Hyperglycemia: Check A1c. HTN- On metoprolol , no ambulatory BPs, BP today looks good, refill metoprolol , CMP and CBC. Dyslipidemia, cardiovascular risk:  -Last LDL 257.  Has been reluctant to take statins. - coronary calcium score 0 (October 2023) -heavy smoker,quit 2 years ago, -Plan: Check FLP, today states that she would be willing to take a statin if needed. Small solid pulmonary nodule, patient was a heavy smoker, quit 2 years ago.  Check CT chest for a follow-up Osteopenia: T-score  05/13/2022: -2.1, Rx vitamin D  exercise.  Next  dexa 05/2024 Elevated TSH.  History of, check TFTs. Anxiety: Was very anxious, gynecology prescribed Effexor , doing much better. RTC 4 months CPX

## 2023-12-11 ENCOUNTER — Encounter: Payer: Self-pay | Admitting: Internal Medicine

## 2023-12-12 MED ORDER — ATORVASTATIN CALCIUM 40 MG PO TABS: 40.0000 mg | ORAL_TABLET | Freq: Every day | ORAL | 0 refills | Status: DC

## 2023-12-12 NOTE — Addendum Note (Signed)
 Addended by: Aoife Bold D on: 12/12/2023 08:01 AM   Modules accepted: Orders

## 2023-12-17 ENCOUNTER — Telehealth: Payer: Self-pay

## 2023-12-17 NOTE — Telephone Encounter (Signed)
 Copied from CRM [id number]. Topic: Clinical - Medical Advice >> Dec 17, 2023 10:08 AM Elita Guitar wrote: Reason for CRM: Soyla Duverney from [facility] imaging called and stated that she has an apt on 05/09 for a CT scan,  cpt code 04540, and they need confirmation if the type of order needs an authorization. Please advise at [phone] exts: [phone]

## 2023-12-17 NOTE — Telephone Encounter (Signed)
 [name]  You12 minutes ago (10:34 AM)   [place] Epic is updated showing no auth required. Soyla Duverney should be able to see that update as well.

## 2023-12-19 ENCOUNTER — Other Ambulatory Visit

## 2024-01-13 ENCOUNTER — Other Ambulatory Visit: Payer: Self-pay | Admitting: Obstetrics and Gynecology

## 2024-01-13 NOTE — Telephone Encounter (Signed)
 Medication refill request: prometrium  100mg  Last AEX:  06-20-23 Next AEX: not scheduled Last MMG (if hormonal medication request): 07-07-23 birads 1:neg Refill authorized: please approve if appropriate

## 2024-02-19 ENCOUNTER — Other Ambulatory Visit: Payer: Self-pay | Admitting: Internal Medicine

## 2024-02-19 MED ORDER — METOPROLOL SUCCINATE ER 50 MG PO TB24: 50.0000 mg | ORAL_TABLET | Freq: Every day | ORAL | 4 refills | Status: DC

## 2024-02-19 NOTE — Telephone Encounter (Signed)
 Copied from CRM [id number]. Topic: Clinical - Medication Refill >> Feb 19, 2024 10:09 AM Robinson H wrote: Medication: metoprolol  succinate (TOPROL -XL) 50 MG 24 hr tablet  Has the patient contacted their pharmacy? Yes, wast told to contact provider (Agent: If no, request that the patient contact the pharmacy for the refill. If patient does not wish to contact the pharmacy document the reason why and proceed with request.) (Agent: If yes, when and what did the pharmacy advise?)  This is the patient's preferred pharmacy:  [facility] #[id number] [place],  - [address] AT [facility] OF [address] & [facility] [address] [place] [address] Phone: [phone] Fax: [phone]   Is this the correct pharmacy for this prescription? Yes If no, delete pharmacy and type the correct one.   Has the prescription been filled recently? No  Is the patient out of the medication? No  Has the patient been seen for an appointment in the last year OR does the patient have an upcoming appointment? Yes  Can we respond through MyChart? Yes  Agent: Please be advised that Rx refills may take up to 3 business days. We ask that you follow-up with your pharmacy.

## 2024-02-29 ENCOUNTER — Encounter: Payer: Self-pay | Admitting: Internal Medicine

## 2024-03-16 ENCOUNTER — Other Ambulatory Visit: Payer: Self-pay | Admitting: Internal Medicine

## 2024-04-09 ENCOUNTER — Encounter: Admitting: Internal Medicine

## 2024-04-21 ENCOUNTER — Encounter: Payer: Self-pay | Admitting: Nurse Practitioner

## 2024-04-21 ENCOUNTER — Ambulatory Visit (INDEPENDENT_AMBULATORY_CARE_PROVIDER_SITE_OTHER): Admitting: Nurse Practitioner

## 2024-04-21 VITALS — BP 130/90 | HR 77

## 2024-04-21 DIAGNOSIS — B3731 Acute candidiasis of vulva and vagina: Secondary | ICD-10-CM | POA: Diagnosis not present

## 2024-04-21 DIAGNOSIS — N9489 Other specified conditions associated with female genital organs and menstrual cycle: Secondary | ICD-10-CM | POA: Diagnosis not present

## 2024-04-21 DIAGNOSIS — B009 Herpesviral infection, unspecified: Secondary | ICD-10-CM | POA: Diagnosis not present

## 2024-04-21 LAB — WET PREP FOR TRICH, YEAST, CLUE

## 2024-04-21 MED ORDER — FLUCONAZOLE 150 MG PO TABS: 150.0000 mg | ORAL_TABLET | ORAL | 0 refills | Status: DC

## 2024-04-21 NOTE — Progress Notes (Signed)
   Acute Office Visit  Subjective:    Patient ID: [name], female    DOB: [birth year], 53 y.o.   MRN: [id number]   HPI 53 y.o. presents today for vaginal burning and itching x 2 weeks. Denies discharge. Does have mild odor. Tried monistat twice with no improvement, most recently 2 days ago. Husband passed in May and feels like she has had more HSV outbreaks since. Taking Valtrex  twice a day.   No LMP recorded. Patient has had a hysterectomy.    Review of Systems  Constitutional: Negative.   Genitourinary:  Positive for genital sores and vaginal pain. Negative for vaginal discharge.       Mild odor       Objective:    Physical Exam Constitutional:      Appearance: Normal appearance.  Genitourinary:    Labia:        Right: Rash present.        Left: Rash present.      Vagina: Vaginal discharge present. No erythema.      BP (!) 130/90   Pulse 77   SpO2 99%  Wt Readings from Last 3 Encounters:  12/08/23 191 lb 2 oz (86.7 kg)  07/25/23 193 lb (87.5 kg)  07/17/23 193 lb (87.5 kg)        [name], CMA present as Biomedical engineer.   Wet prep + yeast  Assessment & Plan:   Problem List Items Addressed This Visit   None Visit Diagnoses       HSV (herpes simplex virus) infection    -  Primary   Relevant Medications   fluconazole  (DIFLUCAN ) 150 MG tablet     Vaginal burning       Relevant Orders   WET PREP FOR TRICH, YEAST, CLUE     Vaginal candidiasis       Relevant Medications   fluconazole  (DIFLUCAN ) 150 MG tablet      Plan: Wet prep and exam consistent with yeast. Diflucan  150 mg every 3 days x 3 doses. Can apply lidocaine gel for discomfort. Continue Valtrex  daily. Stress likely cause for more frequent outbreaks.   Return if symptoms worsen or fail to improve.    Teresa DELENA Shutter DNP, 1:56 PM 04/21/2024

## 2024-05-13 ENCOUNTER — Other Ambulatory Visit: Payer: Self-pay

## 2024-05-13 DIAGNOSIS — Z1231 Encounter for screening mammogram for malignant neoplasm of breast: Secondary | ICD-10-CM

## 2024-05-13 DIAGNOSIS — M8580 Other specified disorders of bone density and structure, unspecified site: Secondary | ICD-10-CM

## 2024-05-13 DIAGNOSIS — Z780 Asymptomatic menopausal state: Secondary | ICD-10-CM

## 2024-05-21 ENCOUNTER — Telehealth (HOSPITAL_BASED_OUTPATIENT_CLINIC_OR_DEPARTMENT_OTHER): Payer: Self-pay

## 2024-06-11 ENCOUNTER — Ambulatory Visit: Admitting: Internal Medicine

## 2024-07-02 ENCOUNTER — Other Ambulatory Visit: Payer: Self-pay | Admitting: Obstetrics and Gynecology

## 2024-07-02 DIAGNOSIS — Z79890 Hormone replacement therapy (postmenopausal): Secondary | ICD-10-CM

## 2024-07-02 NOTE — Telephone Encounter (Signed)
 Med refill request: estradiol  0.1 mg patch Last AEX: 06/20/23 Next AEX: 08/24/24 Last MMG (if hormonal med) 07/07/23 BI-RADS 1 negative, MMG apt. Scheduled 07/14/24 Refill authorized: estradiol  0.1 mg patch #8 with 1 refill.

## 2024-07-14 ENCOUNTER — Inpatient Hospital Stay (HOSPITAL_BASED_OUTPATIENT_CLINIC_OR_DEPARTMENT_OTHER): Admission: RE | Admit: 2024-07-14 | Source: Ambulatory Visit

## 2024-07-14 ENCOUNTER — Other Ambulatory Visit (HOSPITAL_BASED_OUTPATIENT_CLINIC_OR_DEPARTMENT_OTHER)

## 2024-07-17 ENCOUNTER — Other Ambulatory Visit: Payer: Self-pay | Admitting: Obstetrics and Gynecology

## 2024-07-20 NOTE — Telephone Encounter (Signed)
 Med refill request:   Venlafaxine  XR (EFFEXOR  XR) 37.5 MG 24 hr capsule  Start:  07/17/23 Disp: 30 capsules Refills:  12  Last AEX: 06/20/23 Next AEX:  08/24/24 Last MMG (if hormonal med):  N/A Refill authorized? Please Advise.

## 2024-07-30 ENCOUNTER — Other Ambulatory Visit: Payer: Self-pay | Admitting: Internal Medicine

## 2024-08-24 ENCOUNTER — Ambulatory Visit: Admitting: Obstetrics and Gynecology

## 2024-08-24 ENCOUNTER — Other Ambulatory Visit: Payer: Self-pay | Admitting: *Deleted

## 2024-08-24 DIAGNOSIS — F411 Generalized anxiety disorder: Secondary | ICD-10-CM

## 2024-08-24 DIAGNOSIS — Z79890 Hormone replacement therapy (postmenopausal): Secondary | ICD-10-CM

## 2024-08-24 DIAGNOSIS — Z01419 Encounter for gynecological examination (general) (routine) without abnormal findings: Secondary | ICD-10-CM

## 2024-08-24 DIAGNOSIS — B009 Herpesviral infection, unspecified: Secondary | ICD-10-CM

## 2024-08-24 MED ORDER — VALACYCLOVIR HCL 500 MG PO TABS: 500.0000 mg | ORAL_TABLET | Freq: Two times a day (BID) | ORAL | 0 refills | Status: AC

## 2024-08-24 MED ORDER — VENLAFAXINE HCL ER 37.5 MG PO CP24: 37.5000 mg | ORAL_CAPSULE | Freq: Every day | ORAL | 0 refills | Status: DC

## 2024-08-24 MED ORDER — ESTRADIOL 0.1 MG/24HR TD PTTW: 1.0000 | MEDICATED_PATCH | TRANSDERMAL | 0 refills | Status: AC

## 2024-08-24 NOTE — Telephone Encounter (Signed)
 GCG r/s AEX. Spoke with patient, confirmed Rx request and pharmacy on file. Advised to keep AEX for future refills.   Patient requesting refills of the following: estradiol  patch 0.1 mg patch twice weekly  venlafaxine  XR 37.5 mg PO daily Valacyclovir  500 mg tab PO daily  Last AEX: 06/20/23-EB Next AEX: 09/15/24 -EB Last MMG 07/07/23, Bi Rads 1 neg Scheduled 08/30/24  Rx pended for 30 days supply, 0RF

## 2024-08-30 ENCOUNTER — Ambulatory Visit (HOSPITAL_BASED_OUTPATIENT_CLINIC_OR_DEPARTMENT_OTHER)

## 2024-08-30 ENCOUNTER — Inpatient Hospital Stay (HOSPITAL_BASED_OUTPATIENT_CLINIC_OR_DEPARTMENT_OTHER): Admission: RE | Admit: 2024-08-30 | Source: Ambulatory Visit

## 2024-09-15 ENCOUNTER — Ambulatory Visit: Admitting: Obstetrics and Gynecology

## 2024-09-15 ENCOUNTER — Encounter: Payer: Self-pay | Admitting: Obstetrics and Gynecology

## 2024-09-15 VITALS — BP 146/82 | HR 85 | Ht 67.13 in | Wt 226.4 lb

## 2024-09-15 DIAGNOSIS — Z01419 Encounter for gynecological examination (general) (routine) without abnormal findings: Secondary | ICD-10-CM

## 2024-09-15 DIAGNOSIS — N763 Subacute and chronic vulvitis: Secondary | ICD-10-CM | POA: Insufficient documentation

## 2024-09-15 DIAGNOSIS — Z1211 Encounter for screening for malignant neoplasm of colon: Secondary | ICD-10-CM

## 2024-09-15 LAB — WET PREP FOR TRICH, YEAST, CLUE

## 2024-09-15 MED ORDER — CLOBETASOL PROPIONATE 0.05 % EX OINT: 1.0000 | TOPICAL_OINTMENT | Freq: Two times a day (BID) | CUTANEOUS | 0 refills | Status: AC

## 2024-09-15 MED ORDER — CLOBETASOL PROPIONATE 0.05 % EX OINT: TOPICAL_OINTMENT | Freq: Two times a day (BID) | CUTANEOUS | Status: DC

## 2024-09-15 NOTE — Progress Notes (Signed)
 [facility] [name] is a 53 y.o. [phone] Widowed Caucasian female here for annual exam.   She reports discomfort that started in September. She was diagnosed with yeast infection. She was given diflucan . 3 doses. The symptoms improved internally. She has had external pruritus. She has erythema. She feels raw.  She scratches at night when she is sleeping. She has tried marriott.  She uses a sensitive skin  wipes. She has tried desitin zinc.   She has some stress incontinence and was wearing pads however she discontinued using them in over a month.   She does not wear underwear. She uses dove sensitive skin.   She had a hysterectomy due to abnormal uterine bleeding that occurred seven years after  an endometrial ablation. She still has her ovaries.   She performs sbe regularly. She is planning to schedule her mammogram for this year.  She has a strong family history of breast cancer and had negative testing for BRCA gene.  She exercises  30 minutes She has not had a colonoscopy.     PCP: None   No LMP recorded. Patient has had a hysterectomy.           Sexually active: No.  .    Exercising: Yes.    Walking 30 minutes daily  Smoker:  no  Health Maintenance: Pap:   Diagnosis  Date Value Ref Range Status  06/14/2022   Final   - Negative for intraepithelial lesion or malignancy (NILM)        reports that she quit smoking about 2 years ago. Her smoking use included cigarettes. She smoked an average of 1 pack per day. She has never used smokeless tobacco. She reports that she does not currently use alcohol. She reports that she does not currently use drugs.  Past Medical History:  Diagnosis Date   Abnormal vaginal Pap smear    Abortion history    x1   Anxiety    BRCA1 negative 11/12/10   NEGATIVE MUTATION   BRCA2 negative 11/12/10   NEGATIVE MUTATION   Chronic cystitis    Herpes simplex    HSV   Hypertension    Miscarriage     x1    Nephrolithiasis 1999   rt kidney functions @ 40%, sees urology routinely (Dr.Peterson), kidney stones, recurrent   Tobacco abuse     Past Surgical History:  Procedure Laterality Date   CESAREAN SECTION     X 2 W BTSP   ENDOMETRIAL ABLATION     LAPAROSCOPIC TOTAL HYSTERECTOMY  01/31/2011   TLH, B oophorectomy   TONSILLECTOMY     TUBAL LIGATION      Current Outpatient Medications  Medication Sig Dispense Refill   aspirin 81 MG tablet Take 81 mg by mouth daily.     estradiol  (VIVELLE -DOT) 0.1 MG/24HR patch Place 1 patch (0.1 mg total) onto the skin 2 (two) times a week. 8 patch 0   fish oil-omega-3 fatty acids 1000 MG capsule Take 2 g by mouth daily.     MAGNESIUM PO Take by mouth.     metoprolol  succinate (TOPROL -XL) 50 MG 24 hr tablet Take 1 tablet (50 mg total) by mouth daily. 90 tablet 1   Multiple Vitamin (MULTIVITAMIN) capsule Take 1 capsule by mouth daily.     valACYclovir  (VALTREX ) 500 MG tablet Take 1 tablet (500 mg total) by mouth 2 (two) times daily. 60 tablet 0   venlafaxine  XR (EFFEXOR -XR) 37.5 MG  24 hr capsule Take 1 capsule (37.5 mg total) by mouth daily with breakfast. 30 capsule 0   VITAMIN D  PO Take by mouth.     Current Facility-Administered Medications  Medication Dose Route Frequency Provider Last Rate Last Admin   clobetasol  ointment (TEMOVATE ) 0.05 %   Topical BID         Family History  Problem Relation Age of Onset   Diabetes Mother    Breast cancer Mother        deceased.   Hypertension Mother    Heart attack Mother        First MI @ 71, CABG in early 62's.   Diabetes Father    Hypertension Father    Heart attack Father        First MI @ 38 - total of 5, deceased.   Breast cancer Maternal Grandmother     Review of Systems  Exam:     General appearance: alert, cooperative and appears stated age Head: normocephalic, without obvious abnormality, atraumatic Neck: no adenopathy, supple, symmetrical, trachea midline and thyroid  normal to  inspection and palpation Lungs: clear to auscultation bilaterally Breasts: normal appearance, no masses or tenderness, No nipple retraction or dimpling, No nipple discharge or bleeding, No axillary adenopathy Heart: regular rate and rhythm Abdomen: soft, non-tender; no masses, no organomegaly Extremities: extremities normal, atraumatic, no cyanosis or edema Skin: skin color, texture, turgor normal. No rashes or lesions Lymph nodes: cervical, supraclavicular, and axillary nodes normal. Neurologic: grossly normal  Pelvic: External genitalia:  hypopigmentation of the vulva and perianal               No abnormal inguinal nodes palpated.              Urethra:  normal appearing urethra with no masses, tenderness or lesions              Bartholins and Skenes: normal                 Vagina: normal appearing vagina with normal color  small amount of white discharge               Cervix: surgically absent               Pap taken: No. Bimanual Exam:  Uterus: Surgically absent               Adnexa: no mass, fullness, tenderness              Rectal exam: No..  Confirms.              Anus:  normal sphincter tone, no lesions  Chaperone was present for exam  Assessment & Plan: Women's annual routine gynecological examination  Subacute vulvitis - Plan: WET PREP FOR TRICH, YEAST, CLUE  Screening for colon cancer - Plan: Ambulatory referral to Gastroenterology  Mammogram screening discussed. 1) Subacute vulvitis Rx for clobetasol  Ointment 0.05% apply to affected area BID for 2 weeks then once a week. Return in 4 weeks for reevaluation. If no improvement plan Vulvar biopsy.  - WET PREP FOR TRICH, YEAST, CLUE  2. Women's annual routine gynecological examination (Primary) Screening for colon cancer - Plan: Ambulatory referral to Gastroenterology  Mammogram screening discussed. Self breast awareness reviewed. Encouraged patient to have a colonoscopy  Guidelines for Calcium , Vitamin D , regular  exercise program including cardiovascular and weight bearing exercise.  3. Screening for colon cancer - Ambulatory referral to Gastroenterology  Follow up 4 weeks for  reevaluation of vuvitis. If no improvement plan vulvar biopsy Rexene Hoit, MD 2/4/20268:57 AM

## 2024-09-17 ENCOUNTER — Ambulatory Visit: Admitting: Internal Medicine

## 2024-09-17 ENCOUNTER — Encounter: Payer: Self-pay | Admitting: Internal Medicine

## 2024-09-17 VITALS — BP 144/100 | HR 84 | Temp 98.2°F | Resp 16 | Ht 67.1 in | Wt 226.2 lb

## 2024-09-17 DIAGNOSIS — F4320 Adjustment disorder, unspecified: Secondary | ICD-10-CM

## 2024-09-17 DIAGNOSIS — E785 Hyperlipidemia, unspecified: Secondary | ICD-10-CM

## 2024-09-17 DIAGNOSIS — R739 Hyperglycemia, unspecified: Secondary | ICD-10-CM

## 2024-09-17 DIAGNOSIS — I10 Essential (primary) hypertension: Secondary | ICD-10-CM

## 2024-09-17 MED ORDER — METOPROLOL SUCCINATE ER 100 MG PO TB24: 100.0000 mg | ORAL_TABLET | Freq: Every day | ORAL | 1 refills | Status: AC

## 2024-09-17 MED ORDER — VENLAFAXINE HCL ER 75 MG PO CP24: 75.0000 mg | ORAL_CAPSULE | Freq: Every day | ORAL | 1 refills | Status: AC

## 2024-09-17 NOTE — Patient Instructions (Addendum)
 Please read your instructions carefully.   GO TO THE LAB :  Get the blood work    Go to the front desk for the checkout Please make an appointment for a physical exam in 3 months  Your blood pressure is a little high, increase metoprolol  XL to 100 mg daily Check your blood pressure regularly Blood pressure goal:  between 110/65 and  130/80. If it is consistently higher or lower, let me know  Increase venlafaxine  XR to 75 mg daily

## 2024-09-17 NOTE — Progress Notes (Unsigned)
 "  Subjective:    Patient ID: [name], female    DOB: 1971-03-23, 54 y.o.   MRN: [id number]  DOS:  09/17/2024 Routine checkup  Discussed the use of AI scribe software for clinical note transcription with the patient, who gave verbal consent to proceed.  History of Present Illness [name] is a 54 year old female with hypertension and anxiety who presents with concerns about weight gain.  She reports significant weight gain that she attributes to stress eating after her husband's death. She walks at least 30 minutes six days a week for the past three months but feels this has not led to weight loss. She works up to 15 hours a day, seven days a week.  She takes metoprolol  50 mg daily for hypertension but recent readings, including at her gynecologist's office, have been slightly elevated. She takes Effexor  at night for anxiety. She has variable mood with good and bad days since her husband's passing and has not pursued counseling due to feasibility.  She started a cholesterol medication around the time of her husband's death but stopped it after developing leg aches that felt like she had been punched. She is unsure if this was due to the medication or her emotional state.  She recently quit tobacco. She declines influenza vaccination.   Wt Readings from Last 3 Encounters:  09/17/24 226 lb 4 oz (102.6 kg)  09/15/24 226 lb 6.4 oz (102.7 kg)  12/08/23 191 lb 2 oz (86.7 kg)         Review of Systems See above   Past Medical History:  Diagnosis Date   Abnormal vaginal Pap smear    Abortion history    x1   Anxiety    BRCA1 negative 11/12/10   NEGATIVE MUTATION   BRCA2 negative 11/12/10   NEGATIVE MUTATION   Chronic cystitis    Herpes simplex    HSV   Hypertension    Miscarriage    x1    Nephrolithiasis 1999   rt kidney functions @ 40%, sees urology routinely (Dr.Peterson), kidney stones, recurrent   Tobacco abuse     Past Surgical History:  Procedure  Laterality Date   CESAREAN SECTION     X 2 W BTSP   ENDOMETRIAL ABLATION     LAPAROSCOPIC TOTAL HYSTERECTOMY  01/31/2011   TLH, B oophorectomy   TONSILLECTOMY     TUBAL LIGATION      Current Outpatient Medications  Medication Instructions   aspirin 81 mg, Daily   clobetasol  ointment (TEMOVATE ) 0.05 % 1 Application, Topical, 2 times daily   estradiol  (VIVELLE -DOT) 0.1 mg, Transdermal, 2 times weekly   fish oil-omega-3 fatty acids 2 g, Daily   MAGNESIUM PO Take by mouth.   metoprolol  succinate (TOPROL -XL) 50 mg, Oral, Daily   Multiple Vitamin (MULTIVITAMIN) capsule 1 capsule, Daily   valACYclovir  (VALTREX ) 500 mg, Oral, 2 times daily   venlafaxine  XR (EFFEXOR -XR) 37.5 mg, Oral, Daily with breakfast   VITAMIN D  PO Take by mouth.       Objective:   Physical Exam BP (!) 144/102   Pulse 84   Temp 98.2 F (36.8 C) (Oral)   Resp 16   Ht 5' 7.1 (1.704 m)   Wt 226 lb 4 oz (102.6 kg)   SpO2 94%   BMI 35.33 kg/m  General:   Well developed, NAD, BMI noted. HEENT:  Normocephalic . Face symmetric, atraumatic Lungs:  CTA B Normal respiratory effort, no intercostal retractions, no accessory  muscle use. Heart: RRR,  no murmur.  Lower extremities: no pretibial edema bilaterally  Skin: Not pale. Not jaundice Neurologic:  alert & oriented X3.  Speech normal, gait appropriate for age and unassisted Psych--  Cognition and judgment appear intact.  Cooperative with normal attention span and concentration.  Behavior appropriate. Tearful when we talk about her deceased husband.      Assessment   Assessment Hyperglycemia A1c 5.9 HTN Anxiety Tobacco abuse-- tried Wellbutrin years ago crazy thoughts Chronic cystitis Kidney stones Hysterectomy ~ 2012, h/o abnormal PAPs   Herpes genital, valtrex  prn BRCA 1-2 neg Vit D def (dx 2018, did not tolerate well ergocalciferol )  Coronary calcium  score 0 (05/2022 Pulmonary nodule: Per CT 05/2022  Assessment and Plan Assessment &  Plan Grieving, anxiety: She is experiencing adjustment disorder with depressed mood following the loss of her husband and is not currently seeking counseling. To help stabilize her mood, the dosage of Venlafaxine  XR has been increased to 75 mg daily.  Weight gain, obesity   Has gained weight since her husband passed several months ago, current BMI 35.  Admits to stress eating. Her weight gain is likely due to stress eating, and regular physical activity has not aided in weight loss. The focus is on dietary modifications to manage her weight while continuing regular physical activity. HTN: BP slightly elevated upon arrival, was also elevated when she saw her gynecologist recently.  Will increase metoprolol  to 100 mg daily and monitor BPs. High cholesterol: LDL was 234 April 2025, was prescribed atorvastatin , developed severe aches and pains.  She stopped it.  I am planning to recheck her cholesterol when she comes back in 3 months. Consider different statin. Hyperglycemia: A1c today.  Social: Lost her husband in May, he was my patient, condolences provided.  General Health Maintenance   Has consistently declined vaccines. Recently saw gynecology, was referred to GI by them.  RTC 3 months    ----    PLAN  Last seen 04/2022. Hyperglycemia: Check A1c. HTN- On metoprolol , no ambulatory BPs, BP today looks good, refill metoprolol , CMP and CBC. Dyslipidemia, cardiovascular risk:  -Last LDL 257.  Has been reluctant to take statins. - coronary calcium  score 0 (October 2023) -heavy smoker,quit 2 years ago, -Plan: Check FLP, today states that she would be willing to take a statin if needed. Small solid pulmonary nodule, patient was a heavy smoker, quit 2 years ago.  Check CT chest for a follow-up Osteopenia: T-score 05/13/2022: -2.1, Rx vitamin D  exercise.  Next  dexa 05/2024 Elevated TSH.  History of, check TFTs. Anxiety: Was very anxious, gynecology prescribed Effexor , doing much  better. RTC 4 months CPX     "

## 2024-10-08 ENCOUNTER — Ambulatory Visit: Admitting: Obstetrics and Gynecology

## 2024-12-21 ENCOUNTER — Encounter: Admitting: Internal Medicine
